# Patient Record
Sex: Female | Born: 1980 | Race: Black or African American | Hispanic: No | Marital: Married | Smoking: Current every day smoker
Health system: Southern US, Community
[De-identification: ages and names within clinical notes are randomized; demographics above are authoritative.]

## PROBLEM LIST (undated history)

## (undated) DIAGNOSIS — N809 Endometriosis, unspecified: Secondary | ICD-10-CM

## (undated) DIAGNOSIS — I10 Essential (primary) hypertension: Secondary | ICD-10-CM

---

## 2014-06-01 ENCOUNTER — Emergency Department
Admit: 2014-06-01 | Disposition: A | Discharge status: Home / Self Care | Payer: Self-pay | Admitting: Emergency Medicine

## 2014-06-01 LAB — COMPREHENSIVE METABOLIC PANEL
Albumin: 3.7 g/dL
Alkaline Phosphatase: 55 U/L
Anion Gap: 7 (ref 7–16)
BUN: 7 mg/dL
Bilirubin,Total: 0.4 mg/dL
Calcium, Total: 8.9 mg/dL
Chloride: 111 mmol/L
Co2: 22 mmol/L
Creatinine: 0.91 mg/dL
EGFR (African American): >60
EGFR (Non-African Amer.): >60
Glucose: 119 mg/dL — ABNORMAL HIGH
Potassium: 3.5 mmol/L
SGOT(AST): 20 U/L
SGPT (ALT): 12 U/L — ABNORMAL LOW
Sodium: 140 mmol/L
Total Protein: 6.7 g/dL

## 2014-06-01 LAB — CBC WITH DIFFERENTIAL/PLATELET
Basophil #: 0.1 10*3/uL (ref 0.0–0.1)
Basophil %: 1.3 %
Eosinophil #: 0.3 10*3/uL (ref 0.0–0.7)
Eosinophil %: 3.6 %
HCT: 35.4 % (ref 35.0–47.0)
HGB: 11.1 g/dL — ABNORMAL LOW (ref 12.0–16.0)
Lymphocyte #: 3.1 10*3/uL (ref 1.0–3.6)
Lymphocyte %: 44.1 %
MCH: 24.5 pg — ABNORMAL LOW (ref 26.0–34.0)
MCHC: 31.2 g/dL — ABNORMAL LOW (ref 32.0–36.0)
MCV: 79 fL — ABNORMAL LOW (ref 80–100)
Monocyte #: 0.8 x10 3/mm (ref 0.2–0.9)
Monocyte %: 10.8 %
Neutrophil #: 2.8 10*3/uL (ref 1.4–6.5)
Neutrophil %: 40.2 %
Platelet: 363 10*3/uL (ref 150–440)
RBC: 4.51 10*6/uL (ref 3.80–5.20)
RDW: 14.7 % — ABNORMAL HIGH (ref 11.5–14.5)
WBC: 7.1 10*3/uL (ref 3.6–11.0)

## 2014-06-01 LAB — URINALYSIS, COMPLETE
Bacteria: NONE SEEN
Bilirubin,UR: NEGATIVE
Glucose,UR: NEGATIVE mg/dL (ref 0–75)
Leukocyte Esterase: NEGATIVE
Nitrite: NEGATIVE
Ph: 6 (ref 4.5–8.0)
Protein: 100
Specific Gravity: 1.027 (ref 1.003–1.030)

## 2014-06-01 LAB — TROPONIN I: Troponin-I: <0.03 ng/mL

## 2014-06-01 LAB — LIPASE, BLOOD: Lipase: 36 U/L

## 2016-02-08 ENCOUNTER — Emergency Department: Payer: Self-pay

## 2016-02-08 ENCOUNTER — Emergency Department
Admission: EM | Admit: 2016-02-08 | Discharge: 2016-02-08 | Disposition: A | Discharge status: Home / Self Care | Payer: Self-pay | Attending: Emergency Medicine | Admitting: Emergency Medicine

## 2016-02-08 ENCOUNTER — Encounter: Payer: Self-pay | Admitting: *Deleted

## 2016-02-08 DIAGNOSIS — N201 Calculus of ureter: Secondary | ICD-10-CM | POA: Insufficient documentation

## 2016-02-08 DIAGNOSIS — I10 Essential (primary) hypertension: Secondary | ICD-10-CM | POA: Insufficient documentation

## 2016-02-08 DIAGNOSIS — Z5181 Encounter for therapeutic drug level monitoring: Secondary | ICD-10-CM | POA: Insufficient documentation

## 2016-02-08 DIAGNOSIS — N200 Calculus of kidney: Secondary | ICD-10-CM

## 2016-02-08 DIAGNOSIS — F17210 Nicotine dependence, cigarettes, uncomplicated: Secondary | ICD-10-CM | POA: Insufficient documentation

## 2016-02-08 DIAGNOSIS — K859 Acute pancreatitis without necrosis or infection, unspecified: Secondary | ICD-10-CM | POA: Insufficient documentation

## 2016-02-08 HISTORY — DX: Essential (primary) hypertension: I10

## 2016-02-08 HISTORY — DX: Endometriosis, unspecified: N80.9

## 2016-02-08 LAB — COMPREHENSIVE METABOLIC PANEL
ALT: 12 U/L — ABNORMAL LOW (ref 14–54)
AST: 27 U/L (ref 15–41)
Albumin: 4.5 g/dL (ref 3.5–5.0)
Alkaline Phosphatase: 54 U/L (ref 38–126)
Anion gap: 10 (ref 5–15)
BUN: 11 mg/dL (ref 6–20)
CO2: 23 mmol/L (ref 22–32)
Calcium: 9.8 mg/dL (ref 8.9–10.3)
Chloride: 106 mmol/L (ref 101–111)
Creatinine, Ser: 1.13 mg/dL — ABNORMAL HIGH (ref 0.44–1.00)
GFR calc Af Amer: >60 mL/min (ref >60)
GFR calc non Af Amer: >60 mL/min (ref >60)
Glucose, Bld: 155 mg/dL — ABNORMAL HIGH (ref 65–99)
Potassium: 3.4 mmol/L — ABNORMAL LOW (ref 3.5–5.1)
Sodium: 139 mmol/L (ref 135–145)
Total Bilirubin: 0.2 mg/dL — ABNORMAL LOW (ref 0.3–1.2)
Total Protein: 8.1 g/dL (ref 6.5–8.1)

## 2016-02-08 LAB — URINALYSIS, COMPLETE (UACMP) WITH MICROSCOPIC
Bacteria, UA: NONE SEEN
Bilirubin Urine: NEGATIVE
Glucose, UA: NEGATIVE mg/dL
Ketones, ur: NEGATIVE mg/dL
Nitrite: NEGATIVE
Protein, ur: 30 mg/dL — AB
Specific Gravity, Urine: >1.046 — ABNORMAL HIGH (ref 1.005–1.030)
pH: 6 (ref 5.0–8.0)

## 2016-02-08 LAB — URINE DRUG SCREEN, QUALITATIVE (ARMC ONLY)
Amphetamines, Ur Screen: NOT DETECTED
Barbiturates, Ur Screen: NOT DETECTED
Benzodiazepine, Ur Scrn: NOT DETECTED
Cannabinoid 50 Ng, Ur ~~LOC~~: NOT DETECTED
Cocaine Metabolite,Ur ~~LOC~~: NOT DETECTED
MDMA (Ecstasy)Ur Screen: NOT DETECTED
Methadone Scn, Ur: NOT DETECTED
Opiate, Ur Screen: POSITIVE — AB
Phencyclidine (PCP) Ur S: NOT DETECTED
Tricyclic, Ur Screen: NOT DETECTED

## 2016-02-08 LAB — CBC WITH DIFFERENTIAL/PLATELET
Basophils Absolute: 0.1 10*3/uL (ref 0–0.1)
Basophils Relative: 1 %
Eosinophils Absolute: 0.2 10*3/uL (ref 0–0.7)
Eosinophils Relative: 2 %
HCT: 32.6 % — ABNORMAL LOW (ref 35.0–47.0)
Hemoglobin: 10.4 g/dL — ABNORMAL LOW (ref 12.0–16.0)
Lymphocytes Relative: 33 %
Lymphs Abs: 3.6 10*3/uL (ref 1.0–3.6)
MCH: 22.5 pg — ABNORMAL LOW (ref 26.0–34.0)
MCHC: 31.9 g/dL — ABNORMAL LOW (ref 32.0–36.0)
MCV: 70.5 fL — ABNORMAL LOW (ref 80.0–100.0)
Monocytes Absolute: 1 10*3/uL — ABNORMAL HIGH (ref 0.2–0.9)
Monocytes Relative: 9 %
Neutro Abs: 6.1 10*3/uL (ref 1.4–6.5)
Neutrophils Relative %: 55 %
Platelets: 468 10*3/uL — ABNORMAL HIGH (ref 150–440)
RBC: 4.62 MIL/uL (ref 3.80–5.20)
RDW: 17.3 % — ABNORMAL HIGH (ref 11.5–14.5)
WBC: 10.9 10*3/uL (ref 3.6–11.0)

## 2016-02-08 LAB — LIPASE, BLOOD
Lipase: 117 U/L — ABNORMAL HIGH (ref 11–51)
Lipase: 88 U/L — ABNORMAL HIGH (ref 11–51)

## 2016-02-08 LAB — ETHANOL: Alcohol, Ethyl (B): <5 mg/dL (ref <5)

## 2016-02-08 MED ORDER — MORPHINE SULFATE (PF) 4 MG/ML IV SOLN
2.0000 mg | Freq: Once | INTRAVENOUS | Status: AC
Start: 2016-02-08 — End: 2016-02-08
  Administered 2016-02-08: 2 mg via INTRAVENOUS

## 2016-02-08 MED ORDER — ONDANSETRON HCL 4 MG/2ML IJ SOLN
INTRAMUSCULAR | Status: AC
  Administered 2016-02-08: 4 mg via INTRAVENOUS
  Filled 2016-02-08: qty 2

## 2016-02-08 MED ORDER — SODIUM CHLORIDE 0.9 % IV BOLUS (SEPSIS)
1000.0000 mL | Freq: Once | INTRAVENOUS | Status: AC
  Administered 2016-02-08: 1000 mL via INTRAVENOUS

## 2016-02-08 MED ORDER — MORPHINE SULFATE (PF) 4 MG/ML IV SOLN
INTRAVENOUS | Status: AC
  Administered 2016-02-08: 2 mg via INTRAVENOUS
  Filled 2016-02-08: qty 1

## 2016-02-08 MED ORDER — ONDANSETRON 4 MG PO TBDP
4.0000 mg | ORAL_TABLET | Freq: Once | ORAL | Status: AC
  Administered 2016-02-08: 4 mg via ORAL
  Filled 2016-02-08: qty 1

## 2016-02-08 MED ORDER — IOPAMIDOL (ISOVUE-300) INJECTION 61%
100.0000 mL | Freq: Once | INTRAVENOUS | Status: AC | PRN
  Administered 2016-02-08: 100 mL via INTRAVENOUS

## 2016-02-08 MED ORDER — HYDROMORPHONE HCL 1 MG/ML IJ SOLN
INTRAMUSCULAR | Status: AC
  Administered 2016-02-08: 1 mg via INTRAVENOUS
  Filled 2016-02-08: qty 1

## 2016-02-08 MED ORDER — ONDANSETRON HCL 4 MG/2ML IJ SOLN
4.0000 mg | Freq: Once | INTRAMUSCULAR | Status: AC
  Administered 2016-02-08: 4 mg via INTRAVENOUS

## 2016-02-08 MED ORDER — HYDROMORPHONE HCL 1 MG/ML IJ SOLN
1.0000 mg | INTRAMUSCULAR | Status: AC
  Administered 2016-02-08: 1 mg via INTRAVENOUS

## 2016-02-08 MED ORDER — OXYCODONE-ACETAMINOPHEN 5-325 MG PO TABS
1.0000 | ORAL_TABLET | Freq: Once | ORAL | Status: AC
  Administered 2016-02-08: 1 via ORAL
  Filled 2016-02-08: qty 1

## 2016-02-08 MED ORDER — IOPAMIDOL (ISOVUE-300) INJECTION 61%: 30.0000 mL | Freq: Once | INTRAVENOUS | Status: DC | PRN

## 2016-02-08 MED ORDER — OXYCODONE-ACETAMINOPHEN 5-325 MG PO TABS: 1.0000 | ORAL_TABLET | ORAL | 0 refills | Status: DC | PRN

## 2016-02-08 MED ORDER — MORPHINE SULFATE (PF) 4 MG/ML IV SOLN
4.0000 mg | INTRAVENOUS | Status: AC
  Administered 2016-02-08: 4 mg via INTRAVENOUS

## 2016-02-08 MED ORDER — MORPHINE SULFATE (PF) 4 MG/ML IV SOLN
INTRAVENOUS | Status: AC
  Filled 2016-02-08: qty 4

## 2016-02-08 MED ORDER — ONDANSETRON HCL 4 MG PO TABS: 4.0000 mg | ORAL_TABLET | Freq: Every day | ORAL | 1 refills | Status: AC | PRN

## 2016-02-08 NOTE — ED Notes (Addendum)
RN's ased pt to provide urine sample. PT stated she could not urinate at this time. Will attempt after more saline has been administered to pt.

## 2016-02-08 NOTE — ED Triage Notes (Signed)
PT arrived to ED reporting abd pain that began sudden;y 30 minutes before calling EMS. Pt has unsteady gate and presents with a decreased level of consciousness. Pt is drooling and spiting upon arrival. Pt is alert and oriented to time situation and self but disoriented to place upon arrival. Pt unable to answer questions due to reported pain.

## 2016-02-08 NOTE — ED Notes (Signed)
ED Provider at bedside. 

## 2016-02-08 NOTE — ED Provider Notes (Signed)
[facility] Emergency Department Provider Note   First MD Initiated Contact with Patient 02/08/16 0007     (approximate)  I have reviewed the triage vital signs and the nursing notes.   HISTORY  Chief Complaint Abdominal Pain    HPI Elizabeth Gould is a 35 y.o. female with bullous chronic medical conditions presents to the emergency department with acute onset of generalized abdominal pain associated with vomiting 30 minutes before presentation. Patient states her current pain score is 10 out of 10. Patient denies any fever afebrile on presentation with temperature 98.7.   Past Medical History:  Diagnosis Date  . Endometriosis   . Hypertension     There are no active problems to display for this patient.   History reviewed. No pertinent surgical history.  Prior to Admission medications   Not on File    Allergies Patient has no allergy information on record.  History reviewed. No pertinent family history.  Social History Social History  Substance Use Topics  . Smoking status: Current Every Day Smoker    Packs/day: 0.50    Types: Cigarettes  . Smokeless tobacco: Never Used  . Alcohol use No    Review of Systems Constitutional: No fever/chills Eyes: No visual changes. ENT: No sore throat. Cardiovascular: Denies chest pain. Respiratory: Denies shortness of breath. Gastrointestinal: Positive for abdominal pain and vomiting.  No diarrhea.  No constipation. Genitourinary: Negative for dysuria. Musculoskeletal: Negative for back pain. Skin: Negative for rash. Neurological: Negative for headaches, focal weakness or numbness.  10-point ROS otherwise negative.  ____________________________________________   PHYSICAL EXAM:  VITAL SIGNS: ED Triage Vitals  Enc Vitals Group     BP 02/08/16 0000 (!) 86/64     Pulse Rate 02/08/16 0000 80     Resp 02/08/16 0000 (!) 22     Temp 02/08/16 0000 98.7 F (37.1 C)     Temp Source 02/08/16  0000 Oral     SpO2 02/08/16 0000 99 %     Weight 02/08/16 0005 230 lb (104.3 kg)     Height 02/08/16 0005 5\' 7"  (1.702 m)     Head Circumference --      Peak Flow --      Pain Score 02/08/16 0006 10     Pain Loc --      Pain Edu? --      Excl. in GC? --     Constitutional: Alert and oriented. Well appearing and in no acute distress. Eyes: Conjunctivae are normal. PERRL. EOMI. Head: Atraumatic. Ears:  Healthy appearing ear canals and TMs bilaterally Nose: No congestion/rhinnorhea. Mouth/Throat: Mucous membranes are moist.  Oropharynx non-erythematous. Neck: No stridor.   Cardiovascular: Normal rate, regular rhythm. Good peripheral circulation. Grossly normal heart sounds. Respiratory: Normal respiratory effort.  No retractions. Lungs CTAB. Gastrointestinal: Left lower quadrant tenderness to palpation.. No distention.  Musculoskeletal: No lower extremity tenderness nor edema. No gross deformities of extremities. Neurologic:  Normal speech and language. No gross focal neurologic deficits are appreciated.  Skin:  Skin is warm, dry and intact. No rash noted. Psychiatric: Mood and affect are normal. Speech and behavior are normal.  ____________________________________________   LABS (all labs ordered are listed, but only abnormal results are displayed)  Labs Reviewed  CBC WITH DIFFERENTIAL/PLATELET - Abnormal; Notable for the following:       Result Value   Hemoglobin 10.4 (*)    HCT 32.6 (*)    MCV 70.5 (*)    MCH 22.5 (*)  MCHC 31.9 (*)    RDW 17.3 (*)    Platelets 468 (*)    Monocytes Absolute 1.0 (*)    All other components within normal limits  COMPREHENSIVE METABOLIC PANEL - Abnormal; Notable for the following:    Potassium 3.4 (*)    Glucose, Bld 155 (*)    Creatinine, Ser 1.13 (*)    ALT 12 (*)    Total Bilirubin 0.2 (*)    All other components within normal limits  LIPASE, BLOOD - Abnormal; Notable for the following:    Lipase 117 (*)    All other components  within normal limits  URINE DRUG SCREEN, QUALITATIVE ([facility] ONLY) - Abnormal; Notable for the following:    Opiate, Ur Screen POSITIVE (*)    All other components within normal limits  URINALYSIS, COMPLETE (UACMP) WITH MICROSCOPIC - Abnormal; Notable for the following:    Color, Urine YELLOW (*)    APPearance CLEAR (*)    Specific Gravity, Urine >1.046 (*)    Hgb urine dipstick SMALL (*)    Protein, ur 30 (*)    Leukocytes, UA TRACE (*)    Squamous Epithelial / LPF 6-30 (*)    All other components within normal limits  LIPASE, BLOOD - Abnormal; Notable for the following:    Lipase 88 (*)    All other components within normal limits  ETHANOL    RADIOLOGY I, [place] N BROWN, personally viewed and evaluated these images (plain radiographs) as part of my medical decision making, as well as reviewing the written report by the radiologist.  Ct Abdomen Pelvis W Contrast  Result Date: 02/08/2016 CLINICAL DATA:  Sudden onset abdominal pain 30 minutes prior to arrival. EXAM: CT ABDOMEN AND PELVIS WITH CONTRAST TECHNIQUE: Multidetector CT imaging of the abdomen and pelvis was performed using the standard protocol following bolus administration of intravenous contrast. CONTRAST:  100mL ISOVUE-300 IOPAMIDOL (ISOVUE-300) INJECTION 61% COMPARISON:  None. FINDINGS: Lower chest: Large hiatal hernia. Lung bases are clear. No effusions. Hepatobiliary: No focal liver abnormality is seen. No gallstones, gallbladder wall thickening, or biliary dilatation. Pancreas: Unremarkable. No pancreatic ductal dilatation or surrounding inflammatory changes. Spleen: Normal in size without focal abnormality. Adrenals/Urinary Tract: Both adrenals are normal. Right kidney and ureter are normal. Left hydronephrosis and hydroureter. Perinephric and periureteral stranding. Punctate calcification may represent a 2 mm distal left ureteral calculus, within 1 cm of the ureterovesical junction. Image 42 series 7. Urinary bladder is  nearly completely empty, grossly unremarkable. Stomach/Bowel: Stomach is within normal limits. Appendix is normal. No evidence of bowel wall thickening, distention, or inflammatory changes. Vascular/Lymphatic: No significant vascular findings are present. No enlarged abdominal or pelvic lymph nodes. Reproductive: Uterus and bilateral adnexa are unremarkable. Other: No ascites.  Small fat containing umbilical hernia. Musculoskeletal: No significant skeletal lesion. IMPRESSION: Left hydronephrosis and hydroureter with perinephric and periureteral stranding consistent with acute obstructive uropathy. Probable 2 mm distal left ureteral calculus, 1 cm from the UVJ. No other acute findings. Hiatal hernia.  Small fat containing umbilical hernia. Electronically Signed   By: Ellery Plunkaniel R Mitchell M.D.   On: 02/08/2016 02:04      Procedures     INITIAL IMPRESSION / ASSESSMENT AND PLAN / ED COURSE  Pertinent labs & imaging results that were available during my care of the patient were reviewed by me and considered in my medical decision making (see chart for details).  Patient was given IV morphine multiple doses without any improvement of pain as such IV Dilaudid was  given with complete resolution of pain.   Clinical Course     ____________________________________________  FINAL CLINICAL IMPRESSION(S) / ED DIAGNOSES  Final diagnoses:  Kidney stone  Acute pancreatitis, unspecified complication status, unspecified pancreatitis type     MEDICATIONS GIVEN DURING THIS VISIT:  Medications  iopamidol (ISOVUE-300) 61 % injection 30 mL (not administered)  morphine 4 MG/ML injection 2 mg (2 mg Intravenous Given 02/08/16 0035)  ondansetron (ZOFRAN) injection 4 mg (4 mg Intravenous Given 02/08/16 0034)  sodium chloride 0.9 % bolus 1,000 mL (0 mLs Intravenous Stopped 02/08/16 0348)  iopamidol (ISOVUE-300) 61 % injection 100 mL (100 mLs Intravenous Contrast Given 02/08/16 0137)  morphine 4 MG/ML injection  4 mg (4 mg Intravenous Given 02/08/16 0130)  HYDROmorphone (DILAUDID) injection 1 mg (1 mg Intravenous Given 02/08/16 0246)     NEW OUTPATIENT MEDICATIONS STARTED DURING THIS VISIT:  New Prescriptions   No medications on file    Modified Medications   No medications on file    Discontinued Medications   No medications on file     Note:  This document was prepared using Dragon voice recognition software and may include unintentional dictation errors.    Darci Currentandolph N Brown, MD 02/08/16 [phone]

## 2016-02-08 NOTE — ED Notes (Signed)
Patient transported to CT 

## 2016-02-08 NOTE — ED Notes (Signed)
PT drinking contrast at this time

## 2016-02-08 NOTE — ED Notes (Signed)
Patient given  Sprite to drink.

## 2016-02-08 NOTE — ED Notes (Signed)
Urine pregnancy NEGATIVE.  

## 2016-04-10 ENCOUNTER — Emergency Department
Admission: EM | Admit: 2016-04-10 | Discharge: 2016-04-10 | Disposition: A | Discharge status: Home / Self Care | Payer: Medicaid Other | Attending: Student in an Organized Health Care Education/Training Program | Admitting: Student in an Organized Health Care Education/Training Program

## 2016-04-10 ENCOUNTER — Encounter: Payer: Self-pay | Admitting: *Deleted

## 2016-04-10 DIAGNOSIS — N809 Endometriosis, unspecified: Secondary | ICD-10-CM | POA: Insufficient documentation

## 2016-04-10 DIAGNOSIS — I10 Essential (primary) hypertension: Secondary | ICD-10-CM | POA: Insufficient documentation

## 2016-04-10 DIAGNOSIS — R1030 Lower abdominal pain, unspecified: Secondary | ICD-10-CM

## 2016-04-10 DIAGNOSIS — F17210 Nicotine dependence, cigarettes, uncomplicated: Secondary | ICD-10-CM | POA: Insufficient documentation

## 2016-04-10 LAB — URINALYSIS, COMPLETE (UACMP) WITH MICROSCOPIC
Glucose, UA: NEGATIVE mg/dL
Ketones, ur: 5 mg/dL — AB
Nitrite: NEGATIVE
Protein, ur: 30 mg/dL — AB
Specific Gravity, Urine: 1.035 — ABNORMAL HIGH (ref 1.005–1.030)
pH: 5 (ref 5.0–8.0)

## 2016-04-10 LAB — CBC
HCT: 29.1 % — ABNORMAL LOW (ref 35.0–47.0)
Hemoglobin: 9.3 g/dL — ABNORMAL LOW (ref 12.0–16.0)
MCH: 21.6 pg — ABNORMAL LOW (ref 26.0–34.0)
MCHC: 31.9 g/dL — ABNORMAL LOW (ref 32.0–36.0)
MCV: 67.8 fL — ABNORMAL LOW (ref 80.0–100.0)
Platelets: 414 10*3/uL (ref 150–440)
RBC: 4.3 MIL/uL (ref 3.80–5.20)
RDW: 17.3 % — ABNORMAL HIGH (ref 11.5–14.5)
WBC: 6.8 10*3/uL (ref 3.6–11.0)

## 2016-04-10 LAB — COMPREHENSIVE METABOLIC PANEL
ALT: 12 U/L — ABNORMAL LOW (ref 14–54)
AST: 19 U/L (ref 15–41)
Albumin: 3.9 g/dL (ref 3.5–5.0)
Alkaline Phosphatase: 57 U/L (ref 38–126)
Anion gap: 6 (ref 5–15)
BUN: 9 mg/dL (ref 6–20)
CO2: 26 mmol/L (ref 22–32)
Calcium: 9.1 mg/dL (ref 8.9–10.3)
Chloride: 107 mmol/L (ref 101–111)
Creatinine, Ser: 0.78 mg/dL (ref 0.44–1.00)
GFR calc Af Amer: >60 mL/min (ref >60)
GFR calc non Af Amer: >60 mL/min (ref >60)
Glucose, Bld: 93 mg/dL (ref 65–99)
Potassium: 3.4 mmol/L — ABNORMAL LOW (ref 3.5–5.1)
Sodium: 139 mmol/L (ref 135–145)
Total Bilirubin: 0.4 mg/dL (ref 0.3–1.2)
Total Protein: 6.7 g/dL (ref 6.5–8.1)

## 2016-04-10 LAB — POCT PREGNANCY, URINE: Preg Test, Ur: NEGATIVE

## 2016-04-10 MED ORDER — NAPROXEN 375 MG PO TABS: 375.0000 mg | ORAL_TABLET | Freq: Two times a day (BID) | ORAL | 0 refills | Status: AC

## 2016-04-10 MED ORDER — HYDROCODONE-ACETAMINOPHEN 5-325 MG PO TABS: 1.0000 | ORAL_TABLET | ORAL | 0 refills | Status: DC | PRN

## 2016-04-10 MED ORDER — CYCLOBENZAPRINE HCL 10 MG PO TABS: 10.0000 mg | ORAL_TABLET | Freq: Three times a day (TID) | ORAL | 0 refills | Status: DC | PRN

## 2016-04-10 MED ORDER — OXYCODONE-ACETAMINOPHEN 5-325 MG PO TABS
2.0000 | ORAL_TABLET | Freq: Once | ORAL | Status: AC
  Administered 2016-04-10: 2 via ORAL
  Filled 2016-04-10: qty 2

## 2016-04-10 NOTE — ED Triage Notes (Signed)
States lower abd pain for several weeks, states hx of endometriosis, states some vaginal spotting, awake and alert in no acute distress

## 2016-04-10 NOTE — ED Notes (Signed)
Pt states percocet "does the trick" for pain, is being driven home to rest.

## 2016-04-10 NOTE — ED Notes (Signed)
Pt states percocet helps her pain, perc ordered and given. Pt has ride in lobby. Appears in no distress at this time.

## 2016-04-10 NOTE — Discharge Instructions (Signed)

## 2016-04-10 NOTE — ED Provider Notes (Signed)
[facility] Emergency Department Provider Note    First MD Initiated Contact with Patient 04/10/16 1003     (approximate)  I have reviewed the triage vital signs and the nursing notes.   HISTORY  Chief Complaint Abdominal Pain    HPI Elizabeth Gould is a 36 y.o. female with history of endometriosis presents with several weeks of constant recurrent lower abdominal pain consistent with her endometriosis. States she's also having some vaginal spotting. The ER today due to concern for persistent pain and 1 taking a second opinion. Patient states that she's been seen at [facility] but does not feel that the they're interested in her care. She's been taking over-the-counter medications including Aleve, Motrin as well as Tylenol without any significant relief. She's discussed the option of surgery with the GYN physician at [facility] but have not moved forward thus far. She denies any fevers.   Past Medical History:  Diagnosis Date  . Endometriosis   . Hypertension    History reviewed. No pertinent family history. History reviewed. No pertinent surgical history. There are no active problems to display for this patient.     Prior to Admission medications   Medication Sig Start Date End Date Taking? Authorizing Provider  cyclobenzaprine (FLEXERIL) 10 MG tablet Take 1 tablet (10 mg total) by mouth 3 (three) times daily as needed for muscle spasms. 04/10/16   Willy Eddy, MD  HYDROcodone-acetaminophen (NORCO) 5-325 MG tablet Take 1 tablet by mouth every 4 (four) hours as needed for moderate pain. 04/10/16   Willy Eddy, MD  naproxen (NAPROSYN) 375 MG tablet Take 1 tablet (375 mg total) by mouth 2 (two) times daily with a meal. 04/10/16 04/20/16  Willy Eddy, MD  ondansetron [facility]) 4 MG tablet Take 1 tablet (4 mg total) by mouth daily as needed for nausea or vomiting. 02/08/16 02/07/17  Darci Current, MD  oxyCODONE-acetaminophen (ROXICET) 5-325 MG tablet Take 1  tablet by mouth every 4 (four) hours as needed for severe pain. 02/08/16   Darci Current, MD    Allergies Patient has no known allergies.    Social History Social History  Substance Use Topics  . Smoking status: Current Every Day Smoker    Packs/day: 0.50    Types: Cigarettes  . Smokeless tobacco: Never Used  . Alcohol use No    Review of Systems Patient denies headaches, rhinorrhea, blurry vision, numbness, shortness of breath, chest pain, edema, cough, abdominal pain, nausea, vomiting, diarrhea, dysuria, fevers, rashes or hallucinations unless otherwise stated above in HPI. ____________________________________________   PHYSICAL EXAM:  VITAL SIGNS: Vitals:   04/10/16 0900  BP: 127/65  Pulse: 85  Resp: 18  Temp: 98.2 F (36.8 C)    Constitutional: Alert and oriented. Well appearing and in no acute distress. Eyes: Conjunctivae are normal. PERRL. EOMI. Head: Atraumatic. Nose: No congestion/rhinnorhea. Mouth/Throat: Mucous membranes are moist.  Oropharynx non-erythematous. Neck: No stridor. Painless ROM. No cervical spine tenderness to palpation Hematological/Lymphatic/Immunilogical: No cervical lymphadenopathy. Cardiovascular: Normal rate, regular rhythm. Grossly normal heart sounds.  Good peripheral circulation. Respiratory: Normal respiratory effort.  No retractions. Lungs CTAB. Gastrointestinal: Soft and nontender. No distention. No abdominal bruits. No CVA tenderness. Genitourinary:  Musculoskeletal: No lower extremity tenderness nor edema.  No joint effusions. Neurologic:  Normal speech and language. No gross focal neurologic deficits are appreciated. No gait instability. Skin:  Skin is warm, dry and intact. No rash noted. Psychiatric: Mood and affect are normal. Speech and behavior are normal.  ____________________________________________  LABS (all labs ordered are listed, but only abnormal results are displayed)  Results for orders placed or  performed during the hospital encounter of 04/10/16 (from the past 24 hour(s))  Comprehensive metabolic panel     Status: Abnormal   Collection Time: 04/10/16  8:59 AM  Result Value Ref Range   Sodium 139 135 - 145 mmol/L   Potassium 3.4 (L) 3.5 - 5.1 mmol/L   Chloride 107 101 - 111 mmol/L   CO2 26 22 - 32 mmol/L   Glucose, Bld 93 65 - 99 mg/dL   BUN 9 6 - 20 mg/dL   Creatinine, Ser 8.110.78 0.44 - 1.00 mg/dL   Calcium 9.1 8.9 - 91.410.3 mg/dL   Total Protein 6.7 6.5 - 8.1 g/dL   Albumin 3.9 3.5 - 5.0 g/dL   AST 19 15 - 41 U/L   ALT 12 (L) 14 - 54 U/L   Alkaline Phosphatase 57 38 - 126 U/L   Total Bilirubin 0.4 0.3 - 1.2 mg/dL   GFR calc non Af Amer >60 >60 mL/min   GFR calc Af Amer >60 >60 mL/min   Anion gap 6 5 - 15  CBC     Status: Abnormal   Collection Time: 04/10/16  8:59 AM  Result Value Ref Range   WBC 6.8 3.6 - 11.0 K/uL   RBC 4.30 3.80 - 5.20 MIL/uL   Hemoglobin 9.3 (L) 12.0 - 16.0 g/dL   HCT 78.229.1 (L) 95.635.0 - 21.347.0 %   MCV 67.8 (L) 80.0 - 100.0 fL   MCH 21.6 (L) 26.0 - 34.0 pg   MCHC 31.9 (L) 32.0 - 36.0 g/dL   RDW 08.617.3 (H) 57.811.5 - 46.914.5 %   Platelets 414 150 - 440 K/uL  Urinalysis, Complete w Microscopic     Status: Abnormal   Collection Time: 04/10/16  9:02 AM  Result Value Ref Range   Color, Urine AMBER (A) YELLOW   APPearance HAZY (A) CLEAR   Specific Gravity, Urine 1.035 (H) 1.005 - 1.030   pH 5.0 5.0 - 8.0   Glucose, UA NEGATIVE NEGATIVE mg/dL   Hgb urine dipstick SMALL (A) NEGATIVE   Bilirubin Urine SMALL (A) NEGATIVE   Ketones, ur 5 (A) NEGATIVE mg/dL   Protein, ur 30 (A) NEGATIVE mg/dL   Nitrite NEGATIVE NEGATIVE   Leukocytes, UA TRACE (A) NEGATIVE   RBC / HPF 0-5 0 - 5 RBC/hpf   WBC, UA 0-5 0 - 5 WBC/hpf   Bacteria, UA RARE (A) NONE SEEN   Squamous Epithelial / LPF 6-30 (A) NONE SEEN   Mucous PRESENT    Ca Oxalate Crys, UA PRESENT   Pregnancy, urine POC     Status: None   Collection Time: 04/10/16  9:50 AM  Result Value Ref Range   Preg Test, Ur NEGATIVE  NEGATIVE   ____________________________________________  EKG ____________________________________________  RADIOLOGY   ____________________________________________   PROCEDURES  Procedure(s) performed:  Procedures    Critical Care performed: no ____________________________________________   INITIAL IMPRESSION / ASSESSMENT AND PLAN / ED COURSE  Pertinent labs & imaging results that were available during my care of the patient were reviewed by me and considered in my medical decision making (see chart for details).  DDX: endometriosis, ectopic, torsion, toa  Wynelle LinkLatoya Brian is a 36 y.o. who presents to the ED with chronic abdominal pain. Patient afebrile hemodynamic stable. There's been no acute change in character of her chronic lower abdominal pain. 2 Phyllis consistent with her chronic endometriosis. We'll provide  pain medication I discussed signs and symptoms for which she should return to the ER. Her blood work is otherwise reassuring. We'll vie referral to [facility] for second opinion.  Have discussed with the patient and available family all diagnostics and treatments performed thus far and all questions were answered to the best of my ability. The patient demonstrates understanding and agreement with plan.      ____________________________________________   FINAL CLINICAL IMPRESSION(S) / ED DIAGNOSES  Final diagnoses:  Lower abdominal pain  Endometriosis      NEW MEDICATIONS STARTED DURING THIS VISIT:  New Prescriptions   CYCLOBENZAPRINE (FLEXERIL) 10 MG TABLET    Take 1 tablet (10 mg total) by mouth 3 (three) times daily as needed for muscle spasms.   HYDROCODONE-ACETAMINOPHEN (NORCO) 5-325 MG TABLET    Take 1 tablet by mouth every 4 (four) hours as needed for moderate pain.   NAPROXEN (NAPROSYN) 375 MG TABLET    Take 1 tablet (375 mg total) by mouth 2 (two) times daily with a meal.     Note:  This document was prepared using Dragon voice recognition  software and may include unintentional dictation errors.    Willy Eddy, MD 04/10/16 1024

## 2016-09-10 ENCOUNTER — Emergency Department
Admission: EM | Admit: 2016-09-10 | Discharge: 2016-09-10 | Disposition: A | Discharge status: Home / Self Care | Payer: Medicaid Other | Attending: Emergency Medicine | Admitting: Emergency Medicine

## 2016-09-10 DIAGNOSIS — F17210 Nicotine dependence, cigarettes, uncomplicated: Secondary | ICD-10-CM | POA: Insufficient documentation

## 2016-09-10 DIAGNOSIS — Z8742 Personal history of other diseases of the female genital tract: Secondary | ICD-10-CM | POA: Insufficient documentation

## 2016-09-10 DIAGNOSIS — N809 Endometriosis, unspecified: Secondary | ICD-10-CM | POA: Insufficient documentation

## 2016-09-10 DIAGNOSIS — R1030 Lower abdominal pain, unspecified: Secondary | ICD-10-CM | POA: Insufficient documentation

## 2016-09-10 LAB — URINALYSIS, COMPLETE (UACMP) WITH MICROSCOPIC
Bilirubin Urine: NEGATIVE
Glucose, UA: NEGATIVE mg/dL
Ketones, ur: 5 mg/dL — AB
Leukocytes, UA: NEGATIVE
Nitrite: NEGATIVE
Protein, ur: 100 mg/dL — AB
Specific Gravity, Urine: 1.029 (ref 1.005–1.030)
pH: 6 (ref 5.0–8.0)

## 2016-09-10 LAB — POCT PREGNANCY, URINE: Preg Test, Ur: NEGATIVE

## 2016-09-10 MED ORDER — KETOROLAC TROMETHAMINE 60 MG/2ML IM SOLN
30.0000 mg | Freq: Once | INTRAMUSCULAR | Status: AC
  Administered 2016-09-10: 30 mg via INTRAMUSCULAR
  Filled 2016-09-10: qty 2

## 2016-09-10 MED ORDER — MEDROXYPROGESTERONE ACETATE 150 MG/ML IM SUSP
150.0000 mg | Freq: Once | INTRAMUSCULAR | Status: AC
  Administered 2016-09-10: 150 mg via INTRAMUSCULAR
  Filled 2016-09-10: qty 1

## 2016-09-10 MED ORDER — CARISOPRODOL 350 MG PO TABS: 350.0000 mg | ORAL_TABLET | Freq: Three times a day (TID) | ORAL | 0 refills | Status: DC | PRN

## 2016-09-10 MED ORDER — CYCLOBENZAPRINE HCL 10 MG PO TABS
10.0000 mg | ORAL_TABLET | Freq: Once | ORAL | Status: AC
  Administered 2016-09-10: 10 mg via ORAL
  Filled 2016-09-10: qty 1

## 2016-09-10 NOTE — ED Triage Notes (Signed)
Pt states that she has endometriosis and has been having pelvic pain for over a month.  Pt states that she last saw her OB-GYN 6 months ago.  Pt states she was receiving depo shot for endometriosis, but insurance no longer pays for it.  Pt states she has not slept in 3 days because of pain in pelvic area.  Pt states she is having some bleeding when she wipes.  Pt is ambulatory to triage and A&Ox4.

## 2016-09-10 NOTE — ED Provider Notes (Signed)
[facility] Emergency Department Provider Note  ____________________________________________   First MD Initiated Contact with Patient 09/10/16 1722     (approximate)  I have reviewed the triage vital signs and the nursing notes.   HISTORY  Chief Complaint Endometriosis and Pelvic Pain   HPI Elizabeth Gould is a 36 y.o. female with a history of endometriosis as well as hypertension who is presenting to the emergency department today complaining of lower abdominal pain over the past month. She says that this feels like her chronic endometriosis. Says that she last had her diaper shot this past December but has had financial difficulties and is therefore not been able to get back in to see her OB/GYN at [facility]. She does not report any nausea or vomiting. Says the pain is a 10 out of 10 right now and says that it comes in waves. Does not note any radiation. Says that she has had spotting vaginally over the past couple days and then today had several fingertip sized clots. Denies any vaginal discharge. Says that she had taken a muscle relaxer several days ago which had relieved the pain.   Past Medical History:  Diagnosis Date  . Endometriosis   . Hypertension     There are no active problems to display for this patient.   History reviewed. No pertinent surgical history.  Prior to Admission medications   Medication Sig Start Date End Date Taking? Authorizing Provider  cyclobenzaprine (FLEXERIL) 10 MG tablet Take 1 tablet (10 mg total) by mouth 3 (three) times daily as needed for muscle spasms. Patient not taking: Reported on 09/10/2016 04/10/16   Willy Eddy, MD  HYDROcodone-acetaminophen [facility]) 5-325 MG tablet Take 1 tablet by mouth every 4 (four) hours as needed for moderate pain. Patient not taking: Reported on 09/10/2016 04/10/16   Willy Eddy, MD  ondansetron (ZOFRAN) 4 MG tablet Take 1 tablet (4 mg total) by mouth daily as needed for nausea or  vomiting. Patient not taking: Reported on 09/10/2016 02/08/16 02/07/17  Darci Current, MD  oxyCODONE-acetaminophen (ROXICET) 5-325 MG tablet Take 1 tablet by mouth every 4 (four) hours as needed for severe pain. Patient not taking: Reported on 09/10/2016 02/08/16   Darci Current, MD    Allergies Patient has no known allergies.  No family history on file.  Social History Social History  Substance Use Topics  . Smoking status: Current Every Day Smoker    Packs/day: 0.50    Types: Cigarettes  . Smokeless tobacco: Never Used  . Alcohol use No    Review of Systems  Constitutional: No fever/chills Eyes: No visual changes. ENT: No sore throat. Cardiovascular: Denies chest pain. Respiratory: Denies shortness of breath. Gastrointestinal:  No nausea, no vomiting.  No diarrhea.  No constipation. Genitourinary: Negative for dysuria. Musculoskeletal: Negative for back pain. Skin: Negative for rash. Neurological: Negative for headaches, focal weakness or numbness.   ____________________________________________   PHYSICAL EXAM:  VITAL SIGNS: ED Triage Vitals [09/10/16 1631]  Enc Vitals Group     BP (!) 145/70     Pulse Rate 92     Resp (!) 96     Temp 98.9 F (37.2 C)     Temp Source Oral     SpO2 99 %     Weight 210 lb (95.3 kg)     Height 5\' 7"  (1.702 m)     Head Circumference      Peak Flow      Pain Score 10  Pain Loc      Pain Edu?      Excl. in GC?     Constitutional: Alert and oriented. Well appearing and in no acute distress. Eyes: Conjunctivae are normal.  Head: Atraumatic. Nose: No congestion/rhinnorhea. Mouth/Throat: Mucous membranes are moist.  Neck: No stridor.   Cardiovascular: Normal rate, regular rhythm. Grossly normal heart sounds.   Respiratory: Normal respiratory effort.  No retractions. Lungs CTAB. Gastrointestinal: Soft and nontender. No distention.  Musculoskeletal: No lower extremity tenderness nor edema.  No joint  effusions. Neurologic:  Normal speech and language. No gross focal neurologic deficits are appreciated. Skin:  Skin is warm, dry and intact. No rash noted. Psychiatric: Mood and affect are normal. Speech and behavior are normal.  ____________________________________________   LABS (all labs ordered are listed, but only abnormal results are displayed)  Labs Reviewed  URINALYSIS, COMPLETE (UACMP) WITH MICROSCOPIC - Abnormal; Notable for the following:       Result Value   Color, Urine AMBER (*)    APPearance HAZY (*)    Hgb urine dipstick MODERATE (*)    Ketones, ur 5 (*)    Protein, ur 100 (*)    Bacteria, UA RARE (*)    Squamous Epithelial / LPF 6-30 (*)    All other components within normal limits  POC URINE PREG, ED  POCT PREGNANCY, URINE   ____________________________________________  EKG   ____________________________________________  RADIOLOGY   ____________________________________________   PROCEDURES  Procedure(s) performed:   Procedures  Critical Care performed:   ____________________________________________   INITIAL IMPRESSION / ASSESSMENT AND PLAN / ED COURSE  Pertinent labs & imaging results that were available during my care of the patient were reviewed by me and considered in my medical decision making (see chart for details).  ----------------------------------------- 6:58 PM on 09/10/2016 -----------------------------------------  Patient with exacerbation of chronic symptoms and off of her Low. I discussed the case with Dr. Tiburcio PeaHarris of OB/GYN who agrees with administering a type of shot in the emergency department. The patient will be following up with Dr. Tiburcio PeaHarris in the clinic. She is requesting a muscle relaxer for home. She'll be discharged with [facility]. She is understanding the plan for follow-up in one to comply.      ____________________________________________   FINAL CLINICAL IMPRESSION(S) / ED DIAGNOSES  Acute on chronic pain from  endometriosis.    NEW MEDICATIONS STARTED DURING THIS VISIT:  New Prescriptions   No medications on file     Note:  This document was prepared using Dragon voice recognition software and may include unintentional dictation errors.     Myrna BlazerSchaevitz, Sheray Grist Matthew, MD 09/10/16 [phone]

## 2016-09-19 ENCOUNTER — Encounter: Payer: Self-pay | Admitting: Obstetrics and Gynecology

## 2016-10-09 ENCOUNTER — Encounter: Payer: Self-pay | Admitting: Obstetrics & Gynecology

## 2016-10-10 ENCOUNTER — Encounter: Payer: Self-pay | Admitting: Obstetrics and Gynecology

## 2016-10-11 ENCOUNTER — Ambulatory Visit (INDEPENDENT_AMBULATORY_CARE_PROVIDER_SITE_OTHER): Payer: Self-pay | Admitting: Obstetrics & Gynecology

## 2016-10-11 ENCOUNTER — Encounter: Payer: Self-pay | Admitting: Obstetrics & Gynecology

## 2016-10-11 VITALS — BP 140/80 | HR 88 | Ht 67.0 in | Wt 243.0 lb

## 2016-10-11 DIAGNOSIS — N809 Endometriosis, unspecified: Secondary | ICD-10-CM | POA: Insufficient documentation

## 2016-10-11 DIAGNOSIS — G8929 Other chronic pain: Secondary | ICD-10-CM | POA: Insufficient documentation

## 2016-10-11 DIAGNOSIS — R102 Pelvic and perineal pain: Secondary | ICD-10-CM

## 2016-10-11 MED ORDER — OXYCODONE-ACETAMINOPHEN 5-325 MG PO TABS: 1.0000 | ORAL_TABLET | ORAL | 0 refills | Status: DC | PRN

## 2016-10-11 NOTE — Progress Notes (Signed)
   Gynecology Pelvic Pain Evaluation   Chief Complaint:  Chief Complaint  Patient presents with  . Endometriosis    History of Present Illness:   Patient is a 36 y.o. [id number] who LMP was No LMP recorded. Patient is not currently having periods (Reason: Irregular Periods).(on Depo) presents today for a problem visit.  She complains of pain.   Her pain is localized to the deep pelvis area, described as constant and stabbing, began several months ago and its severity is described as severe. The pain radiates to the  Non-radiating. She has these associated symptoms which include abdominal pain. Patient has these modifiers which include pain medication that make it better and unable to associate with any factor that make it worse.  Context includes: spontaneous.  Prior h/o endometriosis dx by excision of CS scar tissue (never had dx lap).  Has been on Depo in past which stopped periods and helped for a while but not for the past year.  Has financial concerns and not been consistantly on Depo and has not been able to follow up w [facility] she had been seeing for this.  Recent ER visit at [facility] as felt pain was escalating too much.  Previous evaluation: [facility], ER's. Prior Diagnosis: Endometriosis. Previous Treatment: NSAID (OTC) with no improvement, Narcotics with some improvement, Injection Depo with no recent improvement (last shot one week ago).  PMHx: She  has a past medical history of Endometriosis and Hypertension. Also,  has no past surgical history on file., family history includes Diabetes in her paternal grandfather and paternal grandmother; Hypertension in her mother.,  reports that she has been smoking Cigarettes.  She has been smoking about 0.50 packs per day. She has never used smokeless tobacco. She reports that she does not drink alcohol or use drugs.  She has a current medication list which includes the following prescription(s): carisoprodol, cyclobenzaprine, hydrocodone-acetaminophen,  ondansetron, and oxycodone-acetaminophen. Also, has No Known Allergies.  ROS  Objective: BP 140/80   Pulse 88   Ht 5\' 7"  (1.702 m)   Wt 243 lb (110.2 kg)   BMI 38.06 kg/m  OBGyn Exam  Female chaperone present for pelvic portion of the physical exam  Assessment: 36 y.o. [id number] with Endometriosis.  1. Endometriosis  2. Chronic pelvic pain in female  Problem List Items Addressed This Visit      Other   Endometriosis - Primary   Chronic pelvic pain in female   Relevant Medications   oxyCODONE-acetaminophen (ROXICET) 5-325 MG tablet    NSAIDs.  Percocet temporary, not for chronic use. May benefit from dx lap, vs hysterectomy for pelvic pain and endometriosis tx. Lupron also an option. Pt to investigate insurance possibilities.  [facility] a reasonable option as well since she was seen there in past.  Annamarie Major, MD, [facility], [facility] 10/11/2016  2:57 PM

## 2016-10-11 NOTE — Patient Instructions (Signed)
Endometriosis Endometriosis is a condition in which the tissue that lines the uterus (endometrium) grows outside of its normal location. The tissue may grow in many locations close to the uterus, but it commonly grows on the ovaries, fallopian tubes, vagina, or bowel. When the uterus sheds the endometrium every menstrual cycle, there is bleeding wherever the endometrial tissue is located. This can cause pain because blood is irritating to tissues that are not normally exposed to it. What are the causes? The cause of endometriosis is not known. What increases the risk? You may be more likely to develop endometriosis if you:  Have a family history of endometriosis.  Have never given birth.  Started your period at age 10 or younger.  Have high levels of estrogen in your body.  Were exposed to a certain medicine (diethylstilbestrol) before you were born (in utero).  Had low birth weight.  Were born as a twin, triplet, or other multiple.  Have a BMI of less than 25. BMI is an estimate of body fat and is calculated from height and weight.  What are the signs or symptoms? Often, there are no symptoms of this condition. If you do have symptoms, they may:  Vary depending on where your endometrial tissue is growing.  Occur during your menstrual period (most common) or midcycle.  Come and go, or you may go months with no symptoms at all.  Stop with menopause.  Symptoms may include:  Pain in the back or abdomen.  Heavier bleeding during periods.  Pain during sex.  Painful bowel movements.  Infertility.  Pelvic pain.  Bleeding more than once a month.  How is this diagnosed? This condition is diagnosed based on your symptoms and a physical exam. You may have tests, such as:  Blood tests and urine tests. These may be done to help rule out other possible causes of your symptoms.  Ultrasound, to look for abnormal tissues.  An X-ray of the lower bowel (barium enema).  An  ultrasound that is done through the vagina (transvaginally).  CT scan.  MRI.  Laparoscopy. In this procedure, a lighted, pencil-sized instrument called a laparoscope is inserted into your abdomen through an incision. The laparoscope allows your health care provider to look at the organs inside your body and check for abnormal tissue to confirm the diagnosis. If abnormal tissue is found, your health care provider may remove a small piece of tissue (biopsy) to be examined under a microscope.  How is this treated? Treatment for this condition may include:  Medicines to relieve pain, such as NSAIDs.  Hormone therapy. This involves using artificial (synthetic) hormones to reduce endometrial tissue growth. Your health care provider may recommend using a hormonal form of birth control, or other medicines.  Surgery. This may be done to remove abnormal endometrial tissue. ? In some cases, tissue may be removed using a laparoscope and a laser (laparoscopic laser treatment). ? In severe cases, surgery may be done to remove the fallopian tubes, uterus, and ovaries (hysterectomy).  Follow these instructions at home:  Take over-the-counter and prescription medicines only as told by your health care provider.  Do not drive or use heavy machinery while taking prescription pain medicine.  Try to avoid activities that cause pain, including sexual activity.  Keep all follow-up visits as told by your health care provider. This is important. Contact a health care provider if:  You have pain in the area between your hip bones (pelvic area) that occurs: ? Before, during, or   after your period. ? In between your period and gets worse during your period. ? During or after sex. ? With bowel movements or urination, especially during your period.  You have problems getting pregnant.  You have a fever. Get help right away if:  You have severe pain that does not get better with medicine.  You have severe  nausea and vomiting, or you cannot eat without vomiting.  You have pain that affects only the lower, right side of your abdomen.  You have abdominal pain that gets worse.  You have abdominal swelling.  You have blood in your stool. This information is not intended to replace advice given to you by your health care provider. Make sure you discuss any questions you have with your health care provider. Document Released: 02/04/2000 Document Revised: 11/12/2015 Document Reviewed: 07/10/2015 Elsevier Interactive Patient Education  2018 [employer]  

## 2016-10-18 ENCOUNTER — Encounter: Payer: Self-pay | Admitting: Obstetrics & Gynecology

## 2017-07-27 ENCOUNTER — Encounter: Payer: Self-pay | Admitting: Obstetrics and Gynecology

## 2017-07-27 ENCOUNTER — Ambulatory Visit (INDEPENDENT_AMBULATORY_CARE_PROVIDER_SITE_OTHER): Payer: Medicaid Other | Admitting: Obstetrics and Gynecology

## 2017-07-27 VITALS — BP 140/86 | HR 102 | Ht 67.0 in | Wt 232.0 lb

## 2017-07-27 DIAGNOSIS — N809 Endometriosis, unspecified: Secondary | ICD-10-CM

## 2017-07-27 DIAGNOSIS — R102 Pelvic and perineal pain: Secondary | ICD-10-CM | POA: Diagnosis not present

## 2017-07-27 MED ORDER — MEDROXYPROGESTERONE ACETATE 150 MG/ML IM SUSP
150.0000 mg | INTRAMUSCULAR | 3 refills | Status: DC
Start: 2017-07-27 — End: 2020-02-21

## 2017-07-27 NOTE — Progress Notes (Signed)
Endometriosis and pain Was diagnosed with endometriosis in 2014 and had a surgery to break up the adhesions. She reports her pain was better for 6 months after this then returned  Depo prescription sent Review medical records.   She reports

## 2017-07-30 ENCOUNTER — Ambulatory Visit: Payer: Medicaid Other

## 2017-07-31 ENCOUNTER — Ambulatory Visit: Payer: Medicaid Other

## 2017-08-13 ENCOUNTER — Ambulatory Visit: Payer: Medicaid Other | Admitting: Obstetrics and Gynecology

## 2017-09-29 ENCOUNTER — Encounter: Payer: Self-pay | Admitting: *Deleted

## 2017-09-29 ENCOUNTER — Emergency Department
Admission: EM | Admit: 2017-09-29 | Discharge: 2017-09-29 | Disposition: A | Discharge status: Home / Self Care | Payer: Medicaid Other | Attending: Emergency Medicine | Admitting: Emergency Medicine

## 2017-09-29 ENCOUNTER — Other Ambulatory Visit: Payer: Self-pay

## 2017-09-29 ENCOUNTER — Emergency Department: Payer: Medicaid Other

## 2017-09-29 DIAGNOSIS — R1013 Epigastric pain: Secondary | ICD-10-CM

## 2017-09-29 DIAGNOSIS — I10 Essential (primary) hypertension: Secondary | ICD-10-CM | POA: Insufficient documentation

## 2017-09-29 DIAGNOSIS — K2900 Acute gastritis without bleeding: Secondary | ICD-10-CM | POA: Diagnosis not present

## 2017-09-29 DIAGNOSIS — F17210 Nicotine dependence, cigarettes, uncomplicated: Secondary | ICD-10-CM | POA: Diagnosis not present

## 2017-09-29 LAB — CBC
HCT: 27 % — ABNORMAL LOW (ref 35.0–47.0)
Hemoglobin: 8.4 g/dL — ABNORMAL LOW (ref 12.0–16.0)
MCH: 19.1 pg — ABNORMAL LOW (ref 26.0–34.0)
MCHC: 31.1 g/dL — ABNORMAL LOW (ref 32.0–36.0)
MCV: 61.3 fL — ABNORMAL LOW (ref 80.0–100.0)
Platelets: 401 10*3/uL (ref 150–440)
RBC: 4.41 MIL/uL (ref 3.80–5.20)
RDW: 19.5 % — ABNORMAL HIGH (ref 11.5–14.5)
WBC: 8.3 10*3/uL (ref 3.6–11.0)

## 2017-09-29 LAB — COMPREHENSIVE METABOLIC PANEL
ALT: 8 U/L (ref 0–44)
AST: 15 U/L (ref 15–41)
Albumin: 3.7 g/dL (ref 3.5–5.0)
Alkaline Phosphatase: 63 U/L (ref 38–126)
Anion gap: 7 (ref 5–15)
BUN: 8 mg/dL (ref 6–20)
CO2: 25 mmol/L (ref 22–32)
Calcium: 8.8 mg/dL — ABNORMAL LOW (ref 8.9–10.3)
Chloride: 109 mmol/L (ref 98–111)
Creatinine, Ser: 0.78 mg/dL (ref 0.44–1.00)
GFR calc Af Amer: >60 mL/min (ref >60)
GFR calc non Af Amer: >60 mL/min (ref >60)
Glucose, Bld: 107 mg/dL — ABNORMAL HIGH (ref 70–99)
Potassium: 3.2 mmol/L — ABNORMAL LOW (ref 3.5–5.1)
Sodium: 141 mmol/L (ref 135–145)
Total Bilirubin: 0.2 mg/dL — ABNORMAL LOW (ref 0.3–1.2)
Total Protein: 7 g/dL (ref 6.5–8.1)

## 2017-09-29 LAB — LIPASE, BLOOD: Lipase: 23 U/L (ref 11–51)

## 2017-09-29 LAB — ETHANOL: Alcohol, Ethyl (B): 12 mg/dL — ABNORMAL HIGH (ref <10)

## 2017-09-29 MED ORDER — FENTANYL CITRATE (PF) 100 MCG/2ML IJ SOLN
50.0000 ug | Freq: Once | INTRAMUSCULAR | Status: AC
  Administered 2017-09-29: 50 ug via INTRAVENOUS
  Filled 2017-09-29: qty 2

## 2017-09-29 MED ORDER — FAMOTIDINE IN NACL 20-0.9 MG/50ML-% IV SOLN
20.0000 mg | Freq: Once | INTRAVENOUS | Status: AC
  Administered 2017-09-29: 20 mg via INTRAVENOUS
  Filled 2017-09-29: qty 50

## 2017-09-29 MED ORDER — ONDANSETRON 4 MG PO TBDP: 4.0000 mg | ORAL_TABLET | Freq: Three times a day (TID) | ORAL | 0 refills | Status: DC | PRN

## 2017-09-29 MED ORDER — SODIUM CHLORIDE 0.9 % IV BOLUS
1000.0000 mL | Freq: Once | INTRAVENOUS | Status: AC
  Administered 2017-09-29: 1000 mL via INTRAVENOUS

## 2017-09-29 MED ORDER — FAMOTIDINE 20 MG PO TABS: 20.0000 mg | ORAL_TABLET | Freq: Two times a day (BID) | ORAL | 0 refills | Status: DC

## 2017-09-29 MED ORDER — ONDANSETRON HCL 4 MG/2ML IJ SOLN
4.0000 mg | Freq: Once | INTRAMUSCULAR | Status: AC
  Administered 2017-09-29: 4 mg via INTRAVENOUS
  Filled 2017-09-29: qty 2

## 2017-09-29 NOTE — ED Notes (Signed)
Pt stated that she could not provider urine sample at this time.

## 2017-09-29 NOTE — Discharge Instructions (Addendum)
1.  Start Pepcid 20 mg twice daily. 2.  You may take Zofran as needed for nausea/vomiting. 3.  Eat a bland diet for the next 5 days, then slowly advance diet as tolerated. 4.  Return to the ER for worsening symptoms, persistent vomiting, difficulty breathing or other concerns.

## 2017-09-29 NOTE — ED Notes (Signed)
Pt stated that she still could not provide urine sample.

## 2017-09-29 NOTE — ED Notes (Signed)
Pt is back from ultrasound.

## 2017-09-29 NOTE — ED Notes (Signed)
Pt is going to ultrasound.   

## 2017-09-29 NOTE — ED Provider Notes (Signed)
[facility] Emergency Department Provider Note   ____________________________________________   First MD Initiated Contact with Patient 09/29/17 0500     (approximate)  I have reviewed the triage vital signs and the nursing notes.   HISTORY  Chief Complaint Abdominal Pain    HPI Elizabeth Gould is a 37 y.o. female who presents to the ED from home with a chief complaint of epigastric pain.  Patient had not yet gone to bed when she felt the pain approximately 20 minutes prior to arrival.  Complains of sharp epigastric pain radiating to her left shoulder.  Denies alcohol use tonight.  Denies fever, chills, chest pain, shortness of breath, nausea, vomiting, diarrhea.  Denies recent travel or trauma.   Past Medical History:  Diagnosis Date  . Endometriosis   . Hypertension     Patient Active Problem List   Diagnosis Date Noted  . Endometriosis 10/11/2016  . Chronic pelvic pain in female 10/11/2016    History reviewed. No pertinent surgical history.  Prior to Admission medications   Medication Sig Start Date End Date Taking? Authorizing Provider  famotidine (PEPCID) 20 MG tablet Take 1 tablet (20 mg total) by mouth 2 (two) times daily. 09/29/17   Irean HongSung, Chan Rosasco J, MD  hydrochlorothiazide (HYDRODIURIL) 25 MG tablet TK 1 T PO  QD. MAKE APPT 05/25/17   [provider]  medroxyPROGESTERone (DEPO-PROVERA) 150 MG/ML injection Inject 1 mL (150 mg total) into the muscle every 3 (three) months. 07/27/17 10/25/17  Natale MilchSchuman, Christanna R, MD  omeprazole (PRILOSEC OTC) 20 MG tablet Take by mouth. 04/12/17 04/12/18  [provider]  ondansetron (ZOFRAN ODT) 4 MG disintegrating tablet Take 1 tablet (4 mg total) by mouth every 8 (eight) hours as needed for nausea or vomiting. 09/29/17   Irean HongSung, Enrigue Hashimi J, MD    Allergies Patient has no known allergies.  Family History  Problem Relation Age of Onset  . Hypertension Mother   . Diabetes Paternal Grandmother   .  Diabetes Paternal Grandfather     Social History Social History   Tobacco Use  . Smoking status: Current Every Day Smoker    Packs/day: 0.50    Types: Cigarettes  . Smokeless tobacco: Never Used  Substance Use Topics  . Alcohol use: No  . Drug use: No    Review of Systems  Constitutional: No fever/chills Eyes: No visual changes. ENT: No sore throat. Cardiovascular: Denies chest pain. Respiratory: Denies shortness of breath. Gastrointestinal: Positive for abdominal pain.  No nausea, no vomiting.  No diarrhea.  No constipation. Genitourinary: Negative for dysuria. Musculoskeletal: Negative for back pain. Skin: Negative for rash. Neurological: Negative for headaches, focal weakness or numbness.   ____________________________________________   PHYSICAL EXAM:  VITAL SIGNS: ED Triage Vitals  Enc Vitals Group     BP 09/29/17 0450 (!) 145/109     Pulse Rate 09/29/17 0450 (!) 107     Resp 09/29/17 0450 16     Temp 09/29/17 0450 98.2 F (36.8 C)     Temp Source 09/29/17 0450 Oral     SpO2 09/29/17 0450 100 %     Weight 09/29/17 0457 215 lb (97.5 kg)     Height 09/29/17 0457 5\' 7"  (1.702 m)     Head Circumference --      Peak Flow --      Pain Score 09/29/17 0456 9     Pain Loc --      Pain Edu? --      Excl.  in GC? --     Constitutional: Alert and oriented. Well appearing and in mild acute distress. Eyes: Conjunctivae are normal. PERRL. EOMI. Head: Atraumatic. Nose: No congestion/rhinnorhea. Mouth/Throat: Mucous membranes are moist.  Oropharynx non-erythematous. Neck: No stridor.   Cardiovascular: Normal rate, regular rhythm. Grossly normal heart sounds.  Good peripheral circulation. Respiratory: Normal respiratory effort.  No retractions. Lungs CTAB. Gastrointestinal: Soft and mildly tender to palpation epigastrium without rebound or guarding. No distention. No abdominal bruits. No CVA tenderness. Musculoskeletal: No lower extremity tenderness nor edema.  No  joint effusions. Neurologic:  Normal speech and language. No gross focal neurologic deficits are appreciated. No gait instability. Skin:  Skin is warm, dry and intact. No rash noted. Psychiatric: Mood and affect are normal. Speech and behavior are normal.  ____________________________________________   LABS (all labs ordered are listed, but only abnormal results are displayed)  Labs Reviewed  COMPREHENSIVE METABOLIC PANEL - Abnormal; Notable for the following components:      Result Value   Potassium 3.2 (*)    Glucose, Bld 107 (*)    Calcium 8.8 (*)    Total Bilirubin 0.2 (*)    All other components within normal limits  CBC - Abnormal; Notable for the following components:   Hemoglobin 8.4 (*)    HCT 27.0 (*)    MCV 61.3 (*)    MCH 19.1 (*)    MCHC 31.1 (*)    RDW 19.5 (*)    All other components within normal limits  ETHANOL - Abnormal; Notable for the following components:   Alcohol, Ethyl (B) 12 (*)    All other components within normal limits  LIPASE, BLOOD  URINALYSIS, COMPLETE (UACMP) WITH MICROSCOPIC  POC URINE PREG, ED   ____________________________________________  EKG  ED ECG REPORT I, Lynnix Schoneman J, the attending physician, personally viewed and interpreted this ECG.   Date: 09/29/2017  EKG Time: 0457  Rate: 110  Rhythm: sinus tachycardia  Axis: Normal  Intervals:none  ST&T Change: Nonspecific  ____________________________________________  RADIOLOGY  ED MD interpretation: Unremarkable abdominal ultrasound  Official radiology report(s): US Abdomen Limited Ruq  Result Date: 09/29/2017 CLINICAL DATA:  Acute onset of epigastric abdominal pain. EXAM: ULTRASOUND ABDOMEN LIMITED RIGHT UPPER QUADRANT COMPARISON:  CT of the abdomen and pelvis from 02/08/2016 FINDINGS: Gallbladder: No gallstones or wall thickening visualized. No sonographic Murphy sign noted by sonographer. Common bile duct: Diameter: 0.4 cm, within normal limits in caliber. Liver: No focal  lesion identified. Increased parenchymal echogenicity likely reflects fatty infiltration. Portal vein is patent on color Doppler imaging with normal direction of blood flow towards the liver. IMPRESSION: 1. No acute abnormality seen at the right upper quadrant. 2. Mild fatty infiltration within the liver. Electronically Signed   By: Roanna Raider M.D.   On: 09/29/2017 06:30    ____________________________________________   PROCEDURES  Procedure(s) performed: None  Procedures  Critical Care performed: No  ____________________________________________   INITIAL IMPRESSION / ASSESSMENT AND PLAN / ED COURSE  As part of my medical decision making, I reviewed the following data within the electronic MEDICAL RECORD NUMBER Nursing notes reviewed and incorporated, Labs reviewed, Old chart reviewed, Radiograph reviewed  and Notes from prior ED visits   37 year old female who presents with epigastric pain. Differential diagnosis includes, but is not limited to, biliary disease (biliary colic, acute cholecystitis, cholangitis, choledocholithiasis, etc), intrathoracic causes for epigastric abdominal pain including ACS, gastritis, duodenitis, pancreatitis, small bowel or large bowel obstruction, abdominal aortic aneurysm, hernia, and ulcer(s).  Will obtain  screening lab work including LFTs and lipase.  Administer 50 mcg IV fentanyl for pain paired with 4 mg IV Zofran for nausea.  Proceed with right upper quadrant abdominal ultrasound to evaluate for cholecystitis.  Clinical Course as of Sep 29 645  Sat Sep 29, 2017  0600 Laboratory results noted.  Noted stable anemia.  Noted EtOH.   [JS]  [id number] Patient is feeling significantly better after IV fentanyl, Zofran, Pepcid and IV fluids.  Updated her on all laboratory and imaging results.  Will discharge home on Pepcid, Zofran as needed and she will follow-up with her PCP next week.  Strict return precautions given.  Patient verbalizes understanding and agrees  with plan of care.   [JS]    Clinical Course User Index [JS] Irean Hong, MD     ____________________________________________   FINAL CLINICAL IMPRESSION(S) / ED DIAGNOSES  Final diagnoses:  Epigastric pain  Acute gastritis without hemorrhage, unspecified gastritis type     ED Discharge Orders         Ordered    famotidine (PEPCID) 20 MG tablet  2 times daily     09/29/17 0646    ondansetron (ZOFRAN ODT) 4 MG disintegrating tablet  Every 8 hours PRN     09/29/17 0646           Note:  This document was prepared using Dragon voice recognition software and may include unintentional dictation errors.    Irean Hong, MD 09/29/17 [phone]

## 2017-09-29 NOTE — ED Triage Notes (Signed)
Pt c/o epigastric pain radiating to L shoulder blade and L neck. Pt states pain woke her from sleep between 15-20 minutes prior to arrival. Pt denies n/v at this time. Pt denies dyspnea at this time.

## 2018-01-06 IMAGING — CT CT ABD-PELV W/ CM
2 of 7 series · 14 of 46 positions shown, 18 images · IV contrast (iopamidol)
Comparison: None.

CLINICAL DATA: Sudden onset abdominal pain 30 minutes prior to
arrival.

EXAM:
CT ABDOMEN AND PELVIS WITH CONTRAST
TECHNIQUE: Multidetector CT imaging of the abdomen and pelvis was performed
using the standard protocol following bolus administration of
intravenous contrast.
CONTRAST:  100mL 2UE1DA-K44 IOPAMIDOL (2UE1DA-K44) INJECTION 61%

[Series 2: routine abd/pel with · axial · 0.96mm/px · z∈[-1182,-792]mm · 11 of 92 slices shown, 15 images]
[im 9/92  soft-tissue]
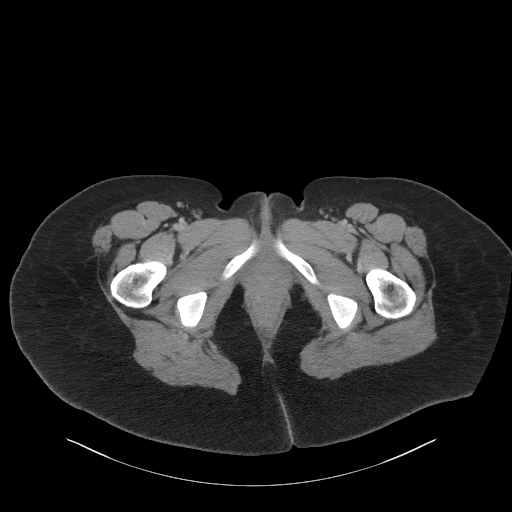
[im 9/92  bone]
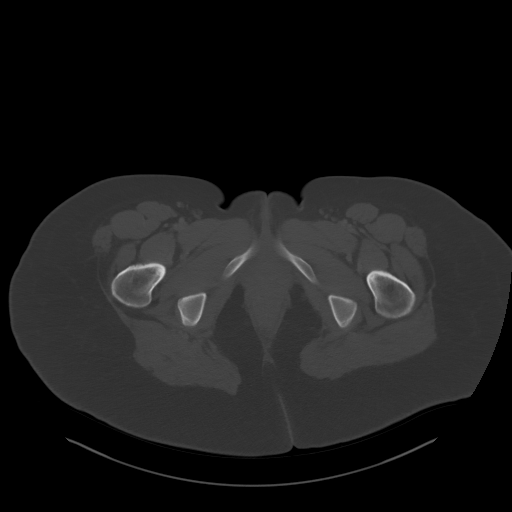
[im 18/92  soft-tissue]
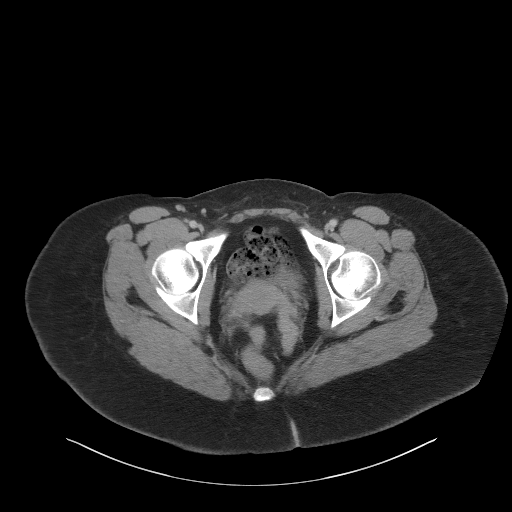
[im 27/92  soft-tissue]
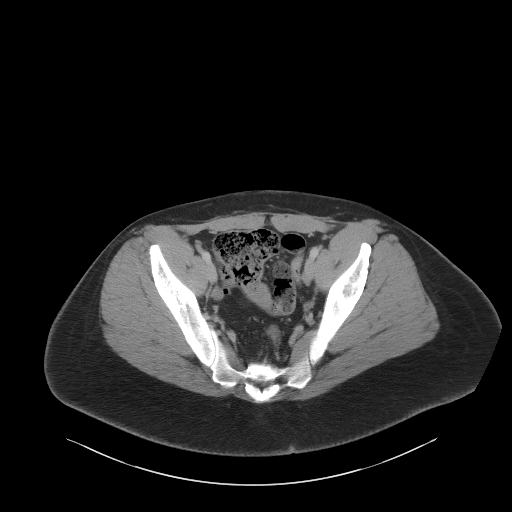
[im 35/92  soft-tissue]
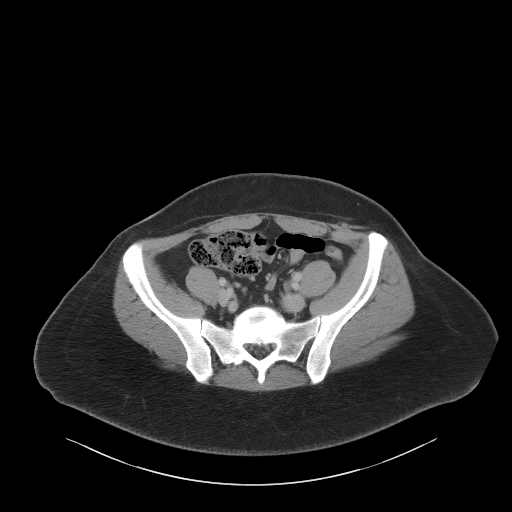
[im 48/92  soft-tissue]
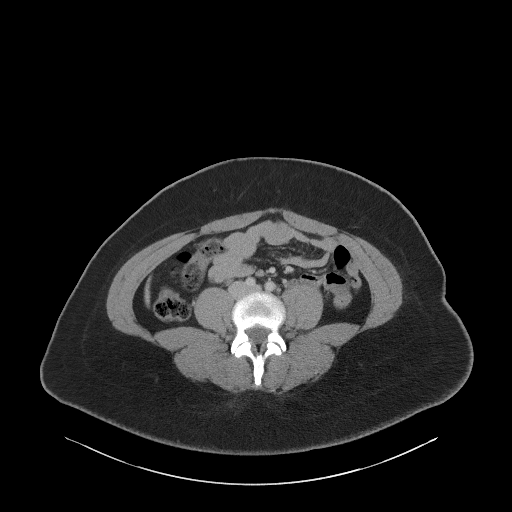
[im 57/92  soft-tissue]
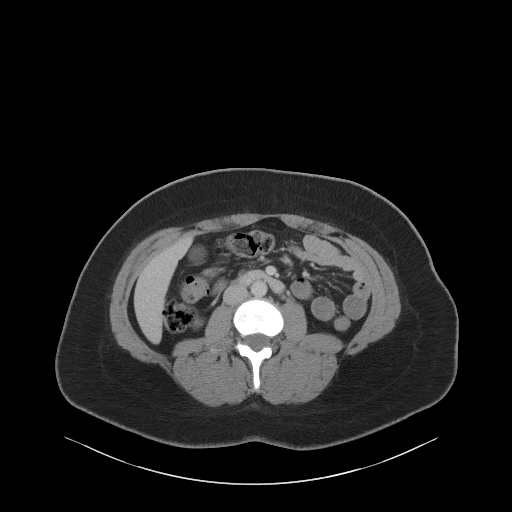
[im 66/92  soft-tissue]
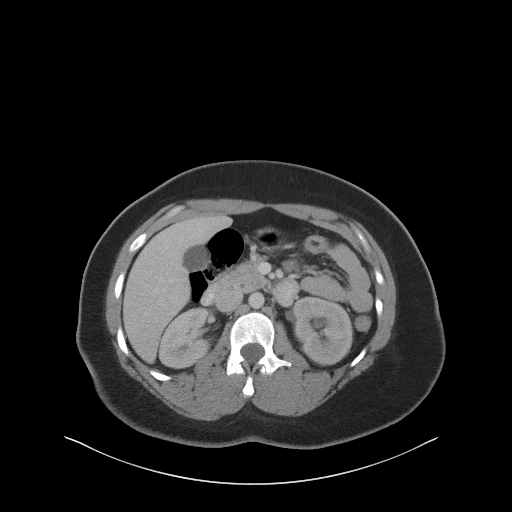
[im 74/92  soft-tissue]
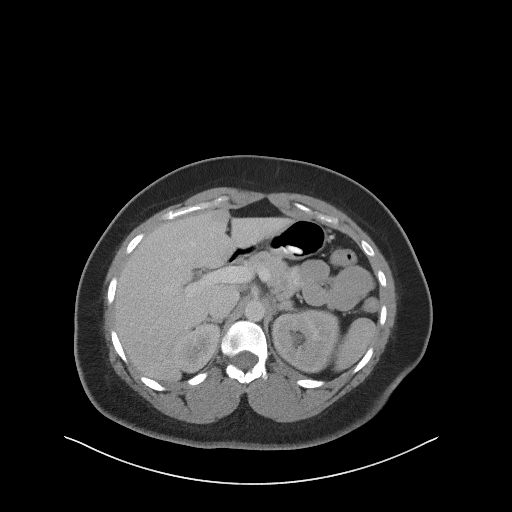
[im 74/92  lung]
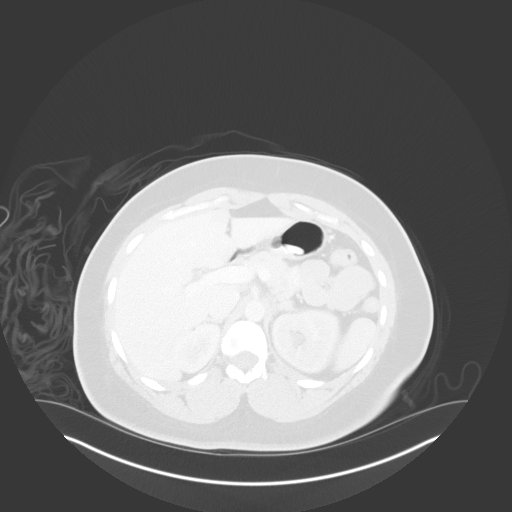
[im 79/92  lung]
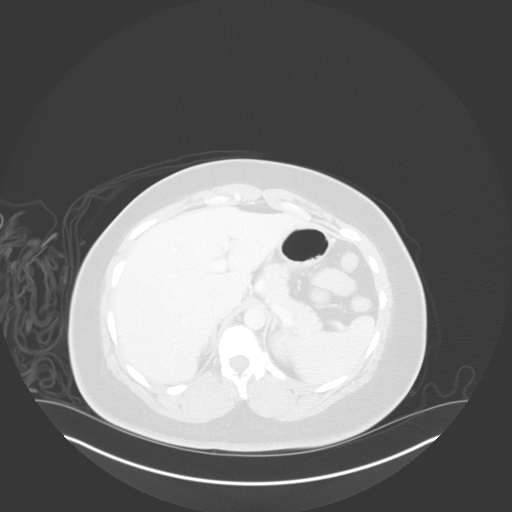
[im 83/92  soft-tissue]
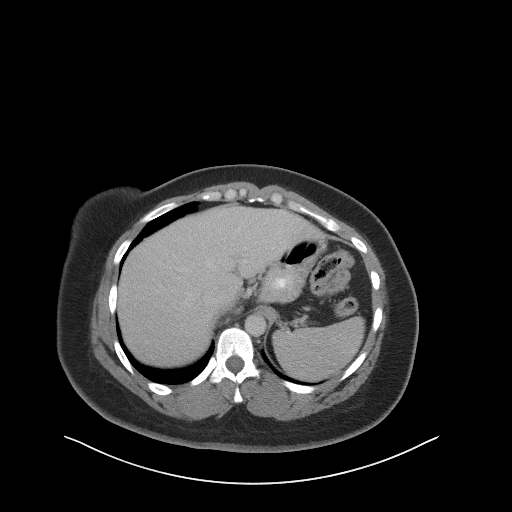
[im 83/92  lung]
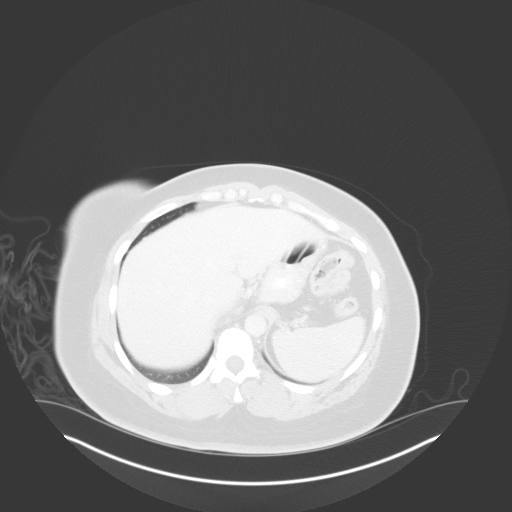
[im 83/92  bone]
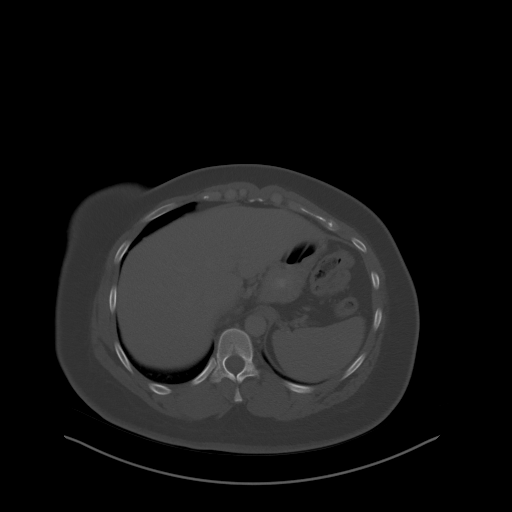
[im 87/92  lung]
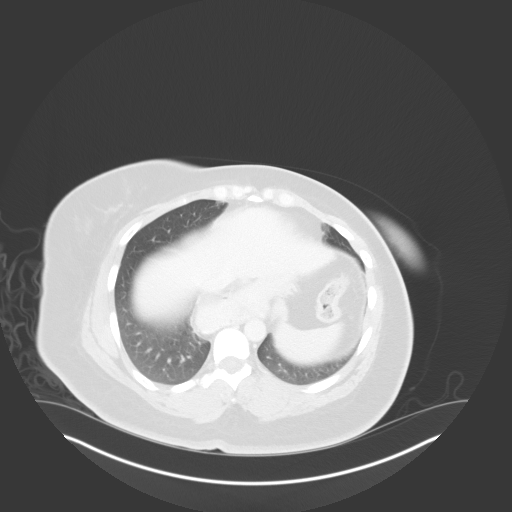

[Series 7: coronal st · coronal · 0.83mm/px · 3 of 86 slices shown]
[im 22/86  soft-tissue]
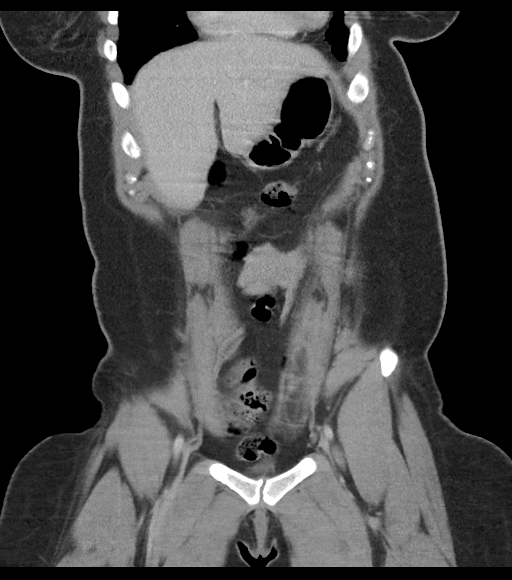
[im 43/86  soft-tissue]
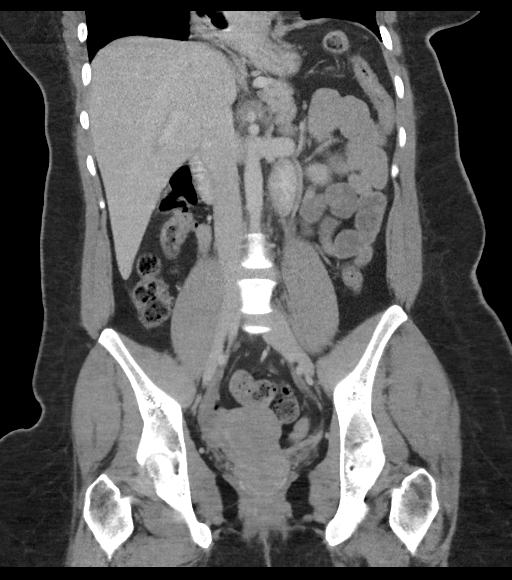
[im 64/86  soft-tissue]
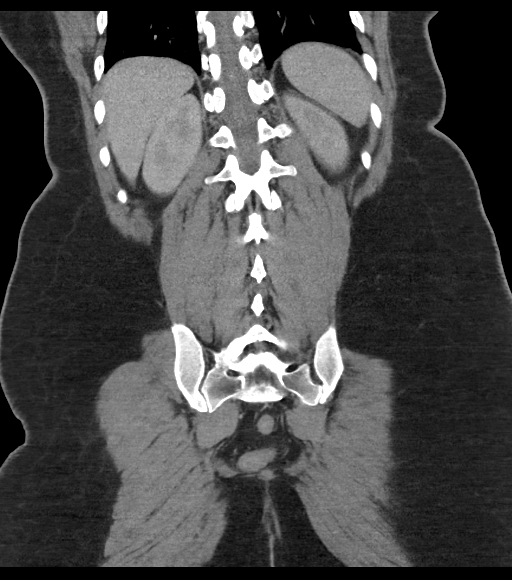

[14 of 46 positions shown; findings below may reference images not displayed]

FINDINGS: Lower chest: Large hiatal hernia. Lung bases are clear. No
effusions.

Hepatobiliary: No focal liver abnormality is seen. No gallstones,
gallbladder wall thickening, or biliary dilatation.

Pancreas: Unremarkable. No pancreatic ductal dilatation or
surrounding inflammatory changes.

Spleen: Normal in size without focal abnormality.

Adrenals/Urinary Tract: Both adrenals are normal. Right kidney and
ureter are normal.

Left hydronephrosis and hydroureter. Perinephric and periureteral
stranding. Punctate calcification may represent a 2 mm distal left
ureteral calculus, within 1 cm of the ureterovesical junction. Image
42 series 7.

Urinary bladder is nearly completely empty, grossly unremarkable.

Stomach/Bowel: Stomach is within normal limits. Appendix is normal.
No evidence of bowel wall thickening, distention, or inflammatory
changes.

Vascular/Lymphatic: No significant vascular findings are present. No
enlarged abdominal or pelvic lymph nodes.

Reproductive: Uterus and bilateral adnexa are unremarkable.

Other: No ascites.  Small fat containing umbilical hernia.

Musculoskeletal: No significant skeletal lesion.
IMPRESSION: Left hydronephrosis and hydroureter with perinephric and
periureteral stranding consistent with acute obstructive uropathy.
Probable 2 mm distal left ureteral calculus, 1 cm from the UVJ.

No other acute findings.

Hiatal hernia.  Small fat containing umbilical hernia.

## 2020-02-20 ENCOUNTER — Encounter: Payer: Self-pay | Admitting: Internal Medicine

## 2020-02-20 ENCOUNTER — Observation Stay: Payer: Medicaid Other

## 2020-02-20 ENCOUNTER — Inpatient Hospital Stay
Admission: EM | Admit: 2020-02-20 | Discharge: 2020-02-22 | DRG: 377 | Disposition: A | Discharge status: Home / Self Care | Payer: Medicaid Other | Attending: Internal Medicine | Admitting: Internal Medicine

## 2020-02-20 DIAGNOSIS — Z79899 Other long term (current) drug therapy: Secondary | ICD-10-CM

## 2020-02-20 DIAGNOSIS — K219 Gastro-esophageal reflux disease without esophagitis: Secondary | ICD-10-CM

## 2020-02-20 DIAGNOSIS — R112 Nausea with vomiting, unspecified: Secondary | ICD-10-CM

## 2020-02-20 DIAGNOSIS — K922 Gastrointestinal hemorrhage, unspecified: Secondary | ICD-10-CM | POA: Diagnosis present

## 2020-02-20 DIAGNOSIS — K920 Hematemesis: Secondary | ICD-10-CM | POA: Diagnosis not present

## 2020-02-20 DIAGNOSIS — K2901 Acute gastritis with bleeding: Secondary | ICD-10-CM

## 2020-02-20 DIAGNOSIS — I10 Essential (primary) hypertension: Secondary | ICD-10-CM | POA: Diagnosis present

## 2020-02-20 DIAGNOSIS — F17210 Nicotine dependence, cigarettes, uncomplicated: Secondary | ICD-10-CM | POA: Diagnosis present

## 2020-02-20 DIAGNOSIS — Z720 Tobacco use: Secondary | ICD-10-CM | POA: Diagnosis present

## 2020-02-20 DIAGNOSIS — U071 COVID-19: Secondary | ICD-10-CM | POA: Diagnosis present

## 2020-02-20 DIAGNOSIS — E876 Hypokalemia: Secondary | ICD-10-CM | POA: Diagnosis present

## 2020-02-20 DIAGNOSIS — R1013 Epigastric pain: Secondary | ICD-10-CM | POA: Diagnosis present

## 2020-02-20 LAB — CBC
HCT: 48.9 % — ABNORMAL HIGH (ref 36.0–46.0)
HCT: 47.4 % — ABNORMAL HIGH (ref 36.0–46.0)
HCT: 45.4 % (ref 36.0–46.0)
Hemoglobin: 16.2 g/dL — ABNORMAL HIGH (ref 12.0–15.0)
Hemoglobin: 15.3 g/dL — ABNORMAL HIGH (ref 12.0–15.0)
Hemoglobin: 14.7 g/dL (ref 12.0–15.0)
MCH: 29.2 pg (ref 26.0–34.0)
MCH: 29 pg (ref 26.0–34.0)
MCH: 28.9 pg (ref 26.0–34.0)
MCHC: 33.1 g/dL (ref 30.0–36.0)
MCHC: 32.3 g/dL (ref 30.0–36.0)
MCHC: 32.4 g/dL (ref 30.0–36.0)
MCV: 88.3 fL (ref 80.0–100.0)
MCV: 89.9 fL (ref 80.0–100.0)
MCV: 89.4 fL (ref 80.0–100.0)
Platelets: 256 10*3/uL (ref 150–400)
Platelets: 270 10*3/uL (ref 150–400)
Platelets: 262 10*3/uL (ref 150–400)
RBC: 5.54 MIL/uL — ABNORMAL HIGH (ref 3.87–5.11)
RBC: 5.27 MIL/uL — ABNORMAL HIGH (ref 3.87–5.11)
RBC: 5.08 MIL/uL (ref 3.87–5.11)
RDW: 12.4 % (ref 11.5–15.5)
RDW: 12.4 % (ref 11.5–15.5)
RDW: 12.5 % (ref 11.5–15.5)
WBC: 6.6 10*3/uL (ref 4.0–10.5)
WBC: 5 10*3/uL (ref 4.0–10.5)
WBC: 6.2 10*3/uL (ref 4.0–10.5)
nRBC: 0 % (ref 0.0–0.2)
nRBC: 0 % (ref 0.0–0.2)
nRBC: 0 % (ref 0.0–0.2)

## 2020-02-20 LAB — COMPREHENSIVE METABOLIC PANEL
ALT: 17 U/L (ref 0–44)
AST: 22 U/L (ref 15–41)
Albumin: 4.3 g/dL (ref 3.5–5.0)
Alkaline Phosphatase: 48 U/L (ref 38–126)
Anion gap: 11 (ref 5–15)
BUN: 12 mg/dL (ref 6–20)
CO2: 25 mmol/L (ref 22–32)
Calcium: 9.7 mg/dL (ref 8.9–10.3)
Chloride: 104 mmol/L (ref 98–111)
Creatinine, Ser: 0.67 mg/dL (ref 0.44–1.00)
GFR, Estimated: >60 mL/min (ref >60)
Glucose, Bld: 128 mg/dL — ABNORMAL HIGH (ref 70–99)
Potassium: 4.2 mmol/L (ref 3.5–5.1)
Sodium: 140 mmol/L (ref 135–145)
Total Bilirubin: 0.5 mg/dL (ref 0.3–1.2)
Total Protein: 7.7 g/dL (ref 6.5–8.1)

## 2020-02-20 LAB — URINE DRUG SCREEN, QUALITATIVE (ARMC ONLY)
Amphetamines, Ur Screen: NOT DETECTED
Barbiturates, Ur Screen: NOT DETECTED
Benzodiazepine, Ur Scrn: NOT DETECTED
Cannabinoid 50 Ng, Ur ~~LOC~~: NOT DETECTED
Cocaine Metabolite,Ur ~~LOC~~: NOT DETECTED
MDMA (Ecstasy)Ur Screen: NOT DETECTED
Methadone Scn, Ur: NOT DETECTED
Opiate, Ur Screen: NOT DETECTED
Phencyclidine (PCP) Ur S: NOT DETECTED
Tricyclic, Ur Screen: NOT DETECTED

## 2020-02-20 LAB — URINALYSIS, COMPLETE (UACMP) WITH MICROSCOPIC
Bacteria, UA: NONE SEEN
Bilirubin Urine: NEGATIVE
Glucose, UA: NEGATIVE mg/dL
Hgb urine dipstick: NEGATIVE
Ketones, ur: 20 mg/dL — AB
Leukocytes,Ua: NEGATIVE
Nitrite: NEGATIVE
Protein, ur: >=300 mg/dL — AB
Specific Gravity, Urine: 1.032 — ABNORMAL HIGH (ref 1.005–1.030)
pH: 6 (ref 5.0–8.0)

## 2020-02-20 LAB — TYPE AND SCREEN
ABO/RH(D): B POS
Antibody Screen: NEGATIVE

## 2020-02-20 LAB — LIPASE, BLOOD: Lipase: 17 U/L (ref 11–51)

## 2020-02-20 LAB — PREGNANCY, URINE: Preg Test, Ur: NEGATIVE

## 2020-02-20 LAB — PROTIME-INR
INR: 1 (ref 0.8–1.2)
Prothrombin Time: 12.5 seconds (ref 11.4–15.2)

## 2020-02-20 LAB — APTT: aPTT: 28 seconds (ref 24–36)

## 2020-02-20 MED ORDER — NICOTINE 21 MG/24HR TD PT24
21.0000 mg | MEDICATED_PATCH | TRANSDERMAL | Status: DC
  Administered 2020-02-20 – 2020-02-21 (×2): 21 mg via TRANSDERMAL
  Filled 2020-02-20 (×2): qty 1

## 2020-02-20 MED ORDER — ACETAMINOPHEN 325 MG PO TABS
650.0000 mg | ORAL_TABLET | Freq: Four times a day (QID) | ORAL | Status: DC | PRN
  Administered 2020-02-22: 650 mg via ORAL
  Filled 2020-02-20: qty 2

## 2020-02-20 MED ORDER — PROMETHAZINE HCL 25 MG/ML IJ SOLN
25.0000 mg | Freq: Once | INTRAMUSCULAR | Status: AC
  Administered 2020-02-20: 25 mg via INTRAVENOUS
  Filled 2020-02-20: qty 1

## 2020-02-20 MED ORDER — PANTOPRAZOLE SODIUM 40 MG IV SOLR
8.0000 mg/h | INTRAVENOUS | Status: DC
  Filled 2020-02-20 (×2): qty 80

## 2020-02-20 MED ORDER — PANTOPRAZOLE SODIUM 40 MG IV SOLR
40.0000 mg | Freq: Two times a day (BID) | INTRAVENOUS | Status: DC
Start: 2020-02-24 — End: 2020-02-20

## 2020-02-20 MED ORDER — ONDANSETRON HCL 4 MG/2ML IJ SOLN
INTRAMUSCULAR | Status: AC
  Administered 2020-02-20: 4 mg via INTRAVENOUS
  Filled 2020-02-20: qty 2

## 2020-02-20 MED ORDER — PANTOPRAZOLE SODIUM 40 MG IV SOLR
40.0000 mg | Freq: Once | INTRAVENOUS | Status: AC
  Administered 2020-02-20: 40 mg via INTRAVENOUS
  Filled 2020-02-20: qty 40

## 2020-02-20 MED ORDER — SODIUM CHLORIDE 0.9 % IV SOLN
1000.0000 mL | Freq: Once | INTRAVENOUS | Status: AC
  Administered 2020-02-20: 1000 mL via INTRAVENOUS

## 2020-02-20 MED ORDER — HYDROMORPHONE HCL 1 MG/ML IJ SOLN
1.0000 mg | INTRAMUSCULAR | Status: DC | PRN
  Administered 2020-02-20 – 2020-02-22 (×6): 1 mg via INTRAVENOUS
  Filled 2020-02-20 (×6): qty 1

## 2020-02-20 MED ORDER — SODIUM CHLORIDE 0.9 % IV SOLN: INTRAVENOUS | Status: DC

## 2020-02-20 MED ORDER — PANTOPRAZOLE SODIUM 40 MG IV SOLR: 40.0000 mg | Freq: Two times a day (BID) | INTRAVENOUS | Status: DC

## 2020-02-20 MED ORDER — IOHEXOL 300 MG/ML  SOLN
100.0000 mL | Freq: Once | INTRAMUSCULAR | Status: AC | PRN
  Administered 2020-02-20: 100 mL via INTRAVENOUS

## 2020-02-20 MED ORDER — ONDANSETRON HCL 4 MG/2ML IJ SOLN
4.0000 mg | Freq: Once | INTRAMUSCULAR | Status: AC
Start: 2020-02-20 — End: 2020-02-20
  Administered 2020-02-20: 4 mg via INTRAVENOUS
  Filled 2020-02-20: qty 2

## 2020-02-20 MED ORDER — METOCLOPRAMIDE HCL 5 MG/ML IJ SOLN
5.0000 mg | Freq: Three times a day (TID) | INTRAMUSCULAR | Status: DC
  Administered 2020-02-20 – 2020-02-22 (×5): 5 mg via INTRAVENOUS
  Filled 2020-02-20 (×6): qty 2

## 2020-02-20 MED ORDER — ONDANSETRON HCL 4 MG/2ML IJ SOLN
4.0000 mg | Freq: Three times a day (TID) | INTRAMUSCULAR | Status: DC | PRN
  Administered 2020-02-20 – 2020-02-21 (×2): 4 mg via INTRAVENOUS
  Filled 2020-02-20 (×2): qty 2

## 2020-02-20 MED ORDER — HYDRALAZINE HCL 20 MG/ML IJ SOLN: 5.0000 mg | INTRAMUSCULAR | Status: DC | PRN

## 2020-02-20 MED ORDER — ONDANSETRON HCL 4 MG/2ML IJ SOLN: 4.0000 mg | Freq: Once | INTRAMUSCULAR | Status: AC

## 2020-02-20 NOTE — ED Notes (Addendum)
Pt weak. Resp reg/unlabored. Pt slow to respond but A&Ox4. Vomiting small amounts currently. Hard stick. Skin dry. Pt in thick pajamas but requesting blanket bc she is "cold". Room temp currently 70.

## 2020-02-20 NOTE — ED Provider Notes (Signed)
[facility] Emergency Department Provider Note   ____________________________________________    I have reviewed the triage vital signs and the nursing notes.   HISTORY  Chief Complaint Hematemesis and Abdominal Pain     HPI Elizabeth Gould is a 39 y.o. female who presents with complaints of epigastric nominal discomfort and vomiting.  She reports her vomitus has been dark but has not seen any overt blood.  Denies fevers or chills.  No sick contacts reported.  No recent alcohol use.  No recent ibuprofen use.  No history of PUD.  Has not take anything for this.  Symptoms started at 2 AM this morning.  Stools are normal  Past Medical History:  Diagnosis Date  . Endometriosis   . Hypertension     Patient Active Problem List   Diagnosis Date Noted  . Endometriosis 10/11/2016  . Chronic pelvic pain in female 10/11/2016    No past surgical history on file.  Prior to Admission medications   Medication Sig Start Date End Date Taking? Authorizing Provider  famotidine (PEPCID) 20 MG tablet Take 1 tablet (20 mg total) by mouth 2 (two) times daily. 09/29/17   Irean Hong, MD  hydrochlorothiazide (HYDRODIURIL) 25 MG tablet TK 1 T PO  QD. MAKE APPT 05/25/17   [provider]  medroxyPROGESTERone (DEPO-PROVERA) 150 MG/ML injection Inject 1 mL (150 mg total) into the muscle every 3 (three) months. 07/27/17 10/25/17  Natale Milch, MD  omeprazole (PRILOSEC OTC) 20 MG tablet Take by mouth. 04/12/17 04/12/18  [provider]  ondansetron (ZOFRAN ODT) 4 MG disintegrating tablet Take 1 tablet (4 mg total) by mouth every 8 (eight) hours as needed for nausea or vomiting. 09/29/17   Irean Hong, MD     Allergies Patient has no known allergies.  Family History  Problem Relation Age of Onset  . Hypertension Mother   . Diabetes Paternal Grandmother   . Diabetes Paternal Grandfather     Social History Social History   Tobacco Use  . Smoking  status: Current Every Day Smoker    Packs/day: 0.50    Types: Cigarettes  . Smokeless tobacco: Never Used  Vaping Use  . Vaping Use: Never used  Substance Use Topics  . Alcohol use: No  . Drug use: No    Review of Systems  Constitutional: No fever/chills Eyes: No visual changes.  ENT: No sore throat. Cardiovascular: Denies chest pain. Respiratory: Denies shortness of breath. Gastrointestinal: As above.   Genitourinary: Negative for dysuria. Musculoskeletal: Negative for back pain. Skin: Negative for rash. Neurological: Negative for headaches or weakness   ____________________________________________   PHYSICAL EXAM:  VITAL SIGNS: ED Triage Vitals  Enc Vitals Group     BP 02/20/20 1244 (!) 148/94     Pulse Rate 02/20/20 1244 70     Resp 02/20/20 1244 20     Temp 02/20/20 1334 98.6 F (37 C)     Temp Source 02/20/20 1334 Oral     SpO2 02/20/20 1244 100 %     Weight --      Height --      Head Circumference --      Peak Flow --      Pain Score 02/20/20 1041 10     Pain Loc --      Pain Edu? --      Excl. in GC? --     Constitutional: Alert and oriented  Nose: No congestion/rhinnorhea. Mouth/Throat: Mucous membranes are  moist.   Neck:  Painless ROM Cardiovascular: Normal rate, regular rhythm. Grossly normal heart sounds.  Good peripheral circulation. Respiratory: Normal respiratory effort.  No retractions. Lungs CTAB. Gastrointestinal: Mild epigastric tenderness, soft. No distention.  No CVA tenderness. Musculoskeletal: No lower extremity tenderness nor edema.  Warm and well perfused Neurologic:  Normal speech and language. No gross focal neurologic deficits are appreciated.  Skin:  Skin is warm, dry and intact. No rash noted. Psychiatric: Mood and affect are normal. Speech and behavior are normal.  ____________________________________________   LABS (all labs ordered are listed, but only abnormal results are displayed)  Labs Reviewed  COMPREHENSIVE  METABOLIC PANEL - Abnormal; Notable for the following components:      Result Value   Glucose, Bld 128 (*)    All other components within normal limits  CBC - Abnormal; Notable for the following components:   RBC 5.54 (*)    Hemoglobin 16.2 (*)    HCT 48.9 (*)    All other components within normal limits  SARS CORONAVIRUS 2 (TAT 6-24 HRS)  LIPASE, BLOOD  URINALYSIS, COMPLETE (UACMP) WITH MICROSCOPIC  TYPE AND SCREEN   ____________________________________________  EKG  ED ECG REPORT I, Jene Every, the attending physician, personally viewed and interpreted this ECG.  Date: 02/20/2020  Rhythm: normal sinus rhythm QRS Axis: normal Intervals: normal ST/T Wave abnormalities: normal Narrative Interpretation: no evidence of acute ischemia  ____________________________________________  RADIOLOGY   ____________________________________________   PROCEDURES  Procedure(s) performed: No  Procedures   Critical Care performed: No ____________________________________________   INITIAL IMPRESSION / ASSESSMENT AND PLAN / ED COURSE  Pertinent labs & imaging results that were available during my care of the patient were reviewed by me and considered in my medical decision making (see chart for details).  Patient with nausea and vomiting as detailed above, mild epigastric discomfort.  Differential includes gastritis, gastroenteritis, PUD  We will treat with IV Zofran, IV fluids, IV Protonix and reevaluate.  Lab work is overall quite reassuring, hemoglobin is normal  Witnessed patient vomited coffee-ground emesis  Additional antiemetics given, without significant improvement.  She will require admission for intractable nausea vomiting, likely PUD   ____________________________________________   FINAL CLINICAL IMPRESSION(S) / ED DIAGNOSES  Final diagnoses:  Hematemesis with nausea  Acute gastritis with hemorrhage, unspecified gastritis type        Note:  This  document was prepared using Dragon voice recognition software and may include unintentional dictation errors.   Jene Every, MD 02/20/20 1630

## 2020-02-20 NOTE — ED Triage Notes (Signed)
Pt presents via POV c/o hematemesis since this am. Also reports feeling weak and dizzy.

## 2020-02-20 NOTE — ED Notes (Signed)
Sent type and screen down with chart labels after multiple unsuccessful attempts to print labels through sunquest on multiple different computers.

## 2020-02-20 NOTE — ED Notes (Signed)
Spoke with Genia Hotter, LPN about IV clotted off.  Explained that IV team has been ordered but we are the last place they come.  She was agreeable for patient to be sent up without an IV.

## 2020-02-20 NOTE — Plan of Care (Signed)
  Problem: Education: Goal: Knowledge of General Education information will improve Description: Including pain rating scale, medication(s)/side effects and non-pharmacologic comfort measures Outcome: Progressing   Problem: Health Behavior/Discharge Planning: Goal: Ability to manage health-related needs will improve Outcome: Progressing   Problem: Clinical Measurements: Goal: Ability to maintain clinical measurements within normal limits will improve Outcome: Progressing Goal: Will remain free from infection Outcome: Progressing Goal: Diagnostic test results will improve Outcome: Progressing Goal: Respiratory complications will improve Outcome: Progressing Goal: Cardiovascular complication will be avoided Outcome: Progressing   Problem: Activity: Goal: Risk for activity intolerance will decrease Outcome: Progressing   Problem: Nutrition: Goal: Adequate nutrition will be maintained Outcome: Progressing   Problem: Coping: Goal: Level of anxiety will decrease Outcome: Progressing   Problem: Elimination: Goal: Will not experience complications related to bowel motility Outcome: Progressing Goal: Will not experience complications related to urinary retention Outcome: Progressing   Problem: Pain Managment: Goal: General experience of comfort will improve Outcome: Progressing   Problem: Skin Integrity: Goal: Risk for impaired skin integrity will decrease Outcome: Progressing   Problem: Education: Goal: Knowledge of disease or condition will improve Outcome: Progressing Goal: Knowledge of the prescribed therapeutic regimen will improve Outcome: Progressing   Problem: Bowel/Gastric: Goal: Occurences of nausea and/or vomiting will decrease Outcome: Progressing   Problem: Fluid Volume: Goal: Maintenance of adequate hydration will improve Outcome: Progressing   Problem: Nutritional: Goal: Achievement of adequate weight for body size and type will improve Outcome:  Progressing

## 2020-02-20 NOTE — H&P (Signed)
History and Physical    Elizabeth Gould AJO:[id number] DOB: August 19, 1980 DOA: 02/20/2020  Referring MD/NP/PA:   PCP: Patient, No Pcp Per   Patient coming from:  The patient is coming from home.  At baseline, pt is independent for most of ADL.        Chief Complaint: Intractable nausea vomiting with coffee-ground emesis and abdominal pain  HPI: Elizabeth Gould is a 39 y.o. female with medical history significant of HTN, GERD, tobacco abuse, endometriosis presents with intractable nausea vomiting with coffee-ground emesis, and abdominal pain.    Patient states that her symptoms started last night.  She has been having intractable nausea, vomiting with coffee-ground emesis.  Patient also has epigastric abdominal pain, which is constant, sharp, 8 out of 10 severity, nonradiating.  No diarrhea.  No bloody stool. She has weakness and lightheadedness.  No symptoms of UTI.  No unilateral numbness or tingling in extremities. No history of PUD, no alcohol abuse or recent ibuprofen use.   ED Course: pt was found to have hemoglobin 16.2, lipase 17, WBC 6.6, pending Covid PCR, pending pregnancy test, electrolytes renal function okay, temperature normal, blood pressure 149/82, heart rate 72, RR 20, oxygen saturation 95% on room air.  Patient is placed on MedSurg bed for observation  Review of Systems:   General: no fevers, chills, no body weight gain, has poor appetite, has fatigue HEENT: no blurry vision, hearing changes or sore throat Respiratory: no dyspnea, coughing, wheezing CV: no chest pain, no palpitations GI: has nausea, vomiting, abdominal pain, hematemesis, no diarrhea, constipation GU: no dysuria, burning on urination, increased urinary frequency, hematuria  Ext: no leg edema Neuro: no unilateral weakness, numbness, or tingling, no vision change or hearing loss Skin: no rash, no skin tear. MSK: No muscle spasm, no deformity, no limitation of range of movement in spin Heme: No easy  bruising.  Travel history: No recent long distant travel.  Allergy: No Known Allergies  Past Medical History:  Diagnosis Date  . Endometriosis   . Hypertension     Past Surgical History:  Procedure Laterality Date  . CESAREAN SECTION      Social History:  reports that she has been smoking cigarettes. She has been smoking about 0.50 packs per day. She has never used smokeless tobacco. She reports that she does not drink alcohol and does not use drugs.  Family History:  Family History  Problem Relation Age of Onset  . Hypertension Mother   . Diabetes Paternal Grandmother   . Diabetes Paternal Grandfather      Prior to Admission medications   Medication Sig Start Date End Date Taking? Authorizing Provider  famotidine (PEPCID) 20 MG tablet Take 1 tablet (20 mg total) by mouth 2 (two) times daily. 09/29/17   Irean Hong, MD  hydrochlorothiazide (HYDRODIURIL) 25 MG tablet TK 1 T PO  QD. MAKE APPT 05/25/17   [provider]  medroxyPROGESTERone (DEPO-PROVERA) 150 MG/ML injection Inject 1 mL (150 mg total) into the muscle every 3 (three) months. 07/27/17 10/25/17  Natale Milch, MD  omeprazole (PRILOSEC OTC) 20 MG tablet Take by mouth. 04/12/17 04/12/18  [provider]  ondansetron (ZOFRAN ODT) 4 MG disintegrating tablet Take 1 tablet (4 mg total) by mouth every 8 (eight) hours as needed for nausea or vomiting. 09/29/17   Irean Hong, MD    Physical Exam: Vitals:   02/20/20 1545 02/20/20 1600 02/20/20 1615 02/20/20 1630  BP:  (!) 155/90  (!) 135/122  Pulse:  75 84 (!) 125 95  Resp:      Temp:      TempSrc:      SpO2: 94% 92% 100% 95%   General: Not in acute distress HEENT:       Eyes: PERRL, EOMI, no scleral icterus.       ENT: No discharge from the ears and nose, no pharynx injection, no tonsillar enlargement.        Neck: No JVD, no bruit, no mass felt. Heme: No neck lymph node enlargement. Cardiac: S1/S2, RRR, No murmurs, No gallops or  rubs. Respiratory: No rales, wheezing, rhonchi or rubs. GI: Soft, nondistended, has tenderness in epigastric area, no rebound pain, no organomegaly, BS present. GU: No hematuria Ext: No pitting leg edema bilaterally. 2+DP/PT pulse bilaterally. Musculoskeletal: No joint deformities, No joint redness or warmth, no limitation of ROM in spin. Skin: No rashes.  Neuro: Alert, oriented X3, cranial nerves II-XII grossly intact, moves all extremities normally.  Psych: Patient is not psychotic, no suicidal or hemocidal ideation.  Labs on Admission: I have personally reviewed following labs and imaging studies  CBC: Recent Labs  Lab 02/20/20 1056  WBC 6.6  HGB 16.2*  HCT 48.9*  MCV 88.3  PLT 256   Basic Metabolic Panel: Recent Labs  Lab 02/20/20 1056  NA 140  K 4.2  CL 104  CO2 25  GLUCOSE 128*  BUN 12  CREATININE 0.67  CALCIUM 9.7   GFR: CrCl cannot be calculated (Unknown ideal weight.). Liver Function Tests: Recent Labs  Lab 02/20/20 1056  AST 22  ALT 17  ALKPHOS 48  BILITOT 0.5  PROT 7.7  ALBUMIN 4.3   Recent Labs  Lab 02/20/20 1056  LIPASE 17   No results for input(s): AMMONIA in the last 168 hours. Coagulation Profile: No results for input(s): INR, PROTIME in the last 168 hours. Cardiac Enzymes: No results for input(s): CKTOTAL, CKMB, CKMBINDEX, TROPONINI in the last 168 hours. BNP (last 3 results) No results for input(s): PROBNP in the last 8760 hours. HbA1C: No results for input(s): HGBA1C in the last 72 hours. CBG: No results for input(s): GLUCAP in the last 168 hours. Lipid Profile: No results for input(s): CHOL, HDL, LDLCALC, TRIG, CHOLHDL, LDLDIRECT in the last 72 hours. Thyroid Function Tests: No results for input(s): TSH, T4TOTAL, FREET4, T3FREE, THYROIDAB in the last 72 hours. Anemia Panel: No results for input(s): VITAMINB12, FOLATE, FERRITIN, TIBC, IRON, RETICCTPCT in the last 72 hours. Urine analysis:    Component Value Date/Time    COLORURINE AMBER (A) 09/10/2016 1740   APPEARANCEUR HAZY (A) 09/10/2016 1740   APPEARANCEUR Cloudy 06/01/2014 1616   LABSPEC 1.029 09/10/2016 1740   LABSPEC 1.027 06/01/2014 1616   PHURINE 6.0 09/10/2016 1740   GLUCOSEU NEGATIVE 09/10/2016 1740   GLUCOSEU Negative 06/01/2014 1616   HGBUR MODERATE (A) 09/10/2016 1740   BILIRUBINUR NEGATIVE 09/10/2016 1740   BILIRUBINUR Negative 06/01/2014 1616   KETONESUR 5 (A) 09/10/2016 1740   PROTEINUR 100 (A) 09/10/2016 1740   NITRITE NEGATIVE 09/10/2016 1740   LEUKOCYTESUR NEGATIVE 09/10/2016 1740   LEUKOCYTESUR Negative 06/01/2014 1616   Sepsis Labs: @LABRCNTIP (procalcitonin:4,lacticidven:4) )No results found for this or any previous visit (from the past 240 hour(s)).   Radiological Exams on Admission: No results found.   EKG: I have personally reviewed.  Sinus rhythm, QTC 439, low voltage, early R wave progression  Assessment/Plan Principal Problem:   Coffee ground emesis Active Problems:   Intractable nausea and vomiting   Hypertension  GERD (gastroesophageal reflux disease)   Tobacco abuse   Epigastric abdominal pain   Intractable nausea and vomiting with coffee ground emesis and epigastric abdominal pain: Etiology is not clear.  Lipase normal.  Differential diagnosis includes acute viral gastritis, marijuana use and Mallory Weiss syndrome.  Hemoglobin stable 16.2.  Currently hemodynamically stable.  [facility] of GI is consulted, he will see patient in the morning since hemoglobin is stable   - will place in med-surg bed obs - Reglan 5 mg every 8 hour - As needed Zofran - Current Dilaudid for abdominal pain - NPO  - IVF: 1L NS bolus, then at 125 mL/hr - Start IV pantoprazole gtt - Avoid NSAIDs and SQ heparin - Maintain IV access (2 large bore IVs if possible). - Monitor closely and follow q6h cbc, transfuse as necessary, if Hgb<7.0 - LaB: INR, PTT and type screen -Follow-up CT of abdomen/pelvis  Hypertension: -IV  hydralazine as needed -Hold HCTZ while patient is n.p.o.  GERD (gastroesophageal reflux disease) -On Protonix IV  Tobacco abuse -Nicotine patch   DVT ppx: SCD Code Status: Full code Family Communication: not done, no family member is at bed side.    Disposition Plan:  Anticipate discharge back to previous environment Consults called:  Dr. Mia Creek of GI Admission status: Med-surg bed for obs   Status is: Observation  The patient remains OBS appropriate and will d/c before 2 midnights.  Dispo: The patient is from: Home              Anticipated d/c is to: Home              Anticipated d/c date is: 1 day              Patient currently is not medically stable to d/c.          Date of Service 02/20/2020    Lorretta Harp [facility]   If 7PM-7AM, please contact night-coverage [email or web address] 02/20/2020, 5:59 PM

## 2020-02-21 ENCOUNTER — Encounter: Payer: Self-pay | Admitting: Internal Medicine

## 2020-02-21 ENCOUNTER — Other Ambulatory Visit: Payer: Self-pay

## 2020-02-21 DIAGNOSIS — F17210 Nicotine dependence, cigarettes, uncomplicated: Secondary | ICD-10-CM | POA: Diagnosis present

## 2020-02-21 DIAGNOSIS — K920 Hematemesis: Secondary | ICD-10-CM | POA: Diagnosis present

## 2020-02-21 DIAGNOSIS — K922 Gastrointestinal hemorrhage, unspecified: Secondary | ICD-10-CM | POA: Diagnosis present

## 2020-02-21 DIAGNOSIS — E876 Hypokalemia: Secondary | ICD-10-CM

## 2020-02-21 DIAGNOSIS — U071 COVID-19: Secondary | ICD-10-CM | POA: Diagnosis present

## 2020-02-21 DIAGNOSIS — I10 Essential (primary) hypertension: Secondary | ICD-10-CM | POA: Diagnosis present

## 2020-02-21 DIAGNOSIS — K2901 Acute gastritis with bleeding: Secondary | ICD-10-CM | POA: Diagnosis present

## 2020-02-21 DIAGNOSIS — K219 Gastro-esophageal reflux disease without esophagitis: Secondary | ICD-10-CM | POA: Diagnosis present

## 2020-02-21 DIAGNOSIS — Z79899 Other long term (current) drug therapy: Secondary | ICD-10-CM | POA: Diagnosis not present

## 2020-02-21 LAB — GLUCOSE, CAPILLARY: Glucose-Capillary: 75 mg/dL (ref 70–99)

## 2020-02-21 LAB — BASIC METABOLIC PANEL
Anion gap: 10 (ref 5–15)
BUN: 9 mg/dL (ref 6–20)
CO2: 25 mmol/L (ref 22–32)
Calcium: 8.7 mg/dL — ABNORMAL LOW (ref 8.9–10.3)
Chloride: 108 mmol/L (ref 98–111)
Creatinine, Ser: 0.74 mg/dL (ref 0.44–1.00)
GFR, Estimated: >60 mL/min (ref >60)
Glucose, Bld: 82 mg/dL (ref 70–99)
Potassium: 3.3 mmol/L — ABNORMAL LOW (ref 3.5–5.1)
Sodium: 143 mmol/L (ref 135–145)

## 2020-02-21 LAB — CBC
HCT: 46.1 % — ABNORMAL HIGH (ref 36.0–46.0)
HCT: 42.3 % (ref 36.0–46.0)
Hemoglobin: 14.6 g/dL (ref 12.0–15.0)
Hemoglobin: 13.6 g/dL (ref 12.0–15.0)
MCH: 28.8 pg (ref 26.0–34.0)
MCH: 28.7 pg (ref 26.0–34.0)
MCHC: 31.7 g/dL (ref 30.0–36.0)
MCHC: 32.2 g/dL (ref 30.0–36.0)
MCV: 90.9 fL (ref 80.0–100.0)
MCV: 89.2 fL (ref 80.0–100.0)
Platelets: 234 10*3/uL (ref 150–400)
Platelets: 240 10*3/uL (ref 150–400)
RBC: 5.07 MIL/uL (ref 3.87–5.11)
RBC: 4.74 MIL/uL (ref 3.87–5.11)
RDW: 12.8 % (ref 11.5–15.5)
RDW: 12.6 % (ref 11.5–15.5)
WBC: 7 10*3/uL (ref 4.0–10.5)
WBC: 6 10*3/uL (ref 4.0–10.5)
nRBC: 0 % (ref 0.0–0.2)
nRBC: 0 % (ref 0.0–0.2)

## 2020-02-21 LAB — HIV ANTIBODY (ROUTINE TESTING W REFLEX): HIV Screen 4th Generation wRfx: NONREACTIVE

## 2020-02-21 LAB — SARS CORONAVIRUS 2 (TAT 6-24 HRS): SARS Coronavirus 2: POSITIVE — AB

## 2020-02-21 MED ORDER — HYDROCHLOROTHIAZIDE 25 MG PO TABS
25.0000 mg | ORAL_TABLET | Freq: Every day | ORAL | Status: DC
  Administered 2020-02-21 – 2020-02-22 (×2): 25 mg via ORAL
  Filled 2020-02-21 (×2): qty 1

## 2020-02-21 MED ORDER — POTASSIUM CHLORIDE CRYS ER 20 MEQ PO TBCR
40.0000 meq | EXTENDED_RELEASE_TABLET | Freq: Once | ORAL | Status: AC
  Administered 2020-02-21: 11:00:00 40 meq via ORAL
  Filled 2020-02-21: qty 2

## 2020-02-21 NOTE — Progress Notes (Signed)
PROGRESS NOTE    Elizabeth Gould  KWI:[id number] DOB: [birth year] DOA: 02/20/2020 PCP: Patient, No Pcp Per   Assessment & Plan:   Principal Problem:   Coffee ground emesis Active Problems:   Intractable nausea and vomiting   Hypertension   GERD (gastroesophageal reflux disease)   Tobacco abuse   Epigastric abdominal pain  Possible GI hemorrhage: etiology unclear, PUD vs gastiritis vs Mallory Weiss tear. Blatchford score of 0, so low risk for a lesion requiring EGD as per GI. Continue on IV PPI.  Avoid NSAIDs. CT abd/pelvis shows no acute process demonstrated in the abd or pelvis. H&H are WNL but will continue to monitor   COVID19 infection: asymptomatic. No need for treatment currently. Continue on airborne and contact precautions   Hypokalemia: KCl repleted. Will continue to monitor  HTN: will restart home dose of HCTZ. IV hydralazine prn  GERD: continue on PPI   Tobacco abuse: nicotine patch to prevent w/drawal. Smoking cessation counseling   DVT prophylaxis: SCDs Code Status: full  Family Communication:  Disposition Plan: likely d/c back home   Status is: Observation  The patient remains OBS appropriate and will d/c before 2 midnights.  Dispo: The patient is from: Home              Anticipated d/c is to: Home              Anticipated d/c date is: 1 day              Patient currently is not medically stable to d/c.      Consultants:   GI   Procedures:   Antimicrobials:   Subjective: Pt c/o malaise   Objective: Vitals:   02/20/20 1824 02/20/20 2010 02/20/20 2346 02/21/20 0528  BP: (!) 158/104 (!) 145/84 (!) 147/75 136/85  Pulse: 75 83 84 81  Resp: 18 16 17 16   Temp: 98.5 F (36.9 C) 98 F (36.7 C) 98.1 F (36.7 C) 98.4 F (36.9 C)  TempSrc:   Oral   SpO2: 98% 96% 97% 99%    Intake/Output Summary (Last 24 hours) at 02/21/2020 04/20/2020 Last data filed at 02/20/2020 1900 Gross per 24 hour  Intake --  Output 400 ml  Net -400 ml   There were no  vitals filed for this visit.  Examination:  General exam: Appears calm and comfortable  Respiratory system: Clear to auscultation. Respiratory effort normal. Cardiovascular system: S1 & S2 +. No  rubs, gallops or clicks.  Gastrointestinal system: Abdomen is nondistended, soft and nontender.  Normal bowel sounds heard. Central nervous system: Alert and oriented. Moves all 4 extremities Psychiatry: Judgement and insight appear normal. Flat mood and affect     Data Reviewed: I have personally reviewed following labs and imaging studies  CBC: Recent Labs  Lab 02/20/20 1056 02/20/20 1900 02/20/20 2227 02/21/20 0535  WBC 6.6 5.0 6.2 7.0  HGB 16.2* 15.3* 14.7 14.6  HCT 48.9* 47.4* 45.4 46.1*  MCV 88.3 89.9 89.4 90.9  PLT 256 270 262 234   Basic Metabolic Panel: Recent Labs  Lab 02/20/20 1056 02/21/20 0535  NA 140 143  K 4.2 3.3*  CL 104 108  CO2 25 25  GLUCOSE 128* 82  BUN 12 9  CREATININE 0.67 0.74  CALCIUM 9.7 8.7*   GFR: CrCl cannot be calculated (Unknown ideal weight.). Liver Function Tests: Recent Labs  Lab 02/20/20 1056  AST 22  ALT 17  ALKPHOS 48  BILITOT 0.5  PROT 7.7  ALBUMIN 4.3  Recent Labs  Lab 02/20/20 1056  LIPASE 17   No results for input(s): AMMONIA in the last 168 hours. Coagulation Profile: Recent Labs  Lab 02/20/20 1900  INR 1.0   Cardiac Enzymes: No results for input(s): CKTOTAL, CKMB, CKMBINDEX, TROPONINI in the last 168 hours. BNP (last 3 results) No results for input(s): PROBNP in the last 8760 hours. HbA1C: No results for input(s): HGBA1C in the last 72 hours. CBG: No results for input(s): GLUCAP in the last 168 hours. Lipid Profile: No results for input(s): CHOL, HDL, LDLCALC, TRIG, CHOLHDL, LDLDIRECT in the last 72 hours. Thyroid Function Tests: No results for input(s): TSH, T4TOTAL, FREET4, T3FREE, THYROIDAB in the last 72 hours. Anemia Panel: No results for input(s): VITAMINB12, FOLATE, FERRITIN, TIBC, IRON,  RETICCTPCT in the last 72 hours. Sepsis Labs: No results for input(s): PROCALCITON, LATICACIDVEN in the last 168 hours.  Recent Results (from the past 240 hour(s))  SARS CORONAVIRUS 2 (TAT 6-24 HRS) Nasopharyngeal Nasopharyngeal Swab     Status: Abnormal   Collection Time: 02/20/20  4:48 PM   Specimen: Nasopharyngeal Swab  Result Value Ref Range Status   SARS Coronavirus 2 POSITIVE (A) NEGATIVE Final    Comment: RESULT CALLED TO, READ BACK BY AND VERIFIED WITH: Lenard Simmer, RN 02/21/20 [place] (NOTE) SARS-CoV-2 target nucleic acids are DETECTED.  The SARS-CoV-2 RNA is generally detectable in upper and lower respiratory specimens during the acute phase of infection. Positive results are indicative of the presence of SARS-CoV-2 RNA. Clinical correlation with patient history and other diagnostic information is  necessary to determine patient infection status. Positive results do not rule out bacterial infection or co-infection with other viruses.  The expected result is Negative.  Fact Sheet for Patients: SugarRoll.be  Fact Sheet for Healthcare Providers: https://www.woods-mathews.com/  This test is not yet approved or cleared by the [place] FDA and  has been authorized for detection and/or diagnosis of SARS-CoV-2 by FDA under an Emergency Use Authorization (EUA). This EUA will remain  in effect (meaning this test can be used ) for the duration of the COVID-19 declaration under Section 564(b)(1) of the Act, 21 U.S.C. section 360bbb-3(b)(1), unless the authorization is terminated or revoked sooner.   Performed at [facility], [place] [address], [place], [place] [address]          Radiology Studies: CT ABDOMEN PELVIS W CONTRAST  Result Date: 02/20/2020 CLINICAL DATA:  Nausea and vomiting.  Weakness and dizziness. EXAM: CT ABDOMEN AND PELVIS WITH CONTRAST TECHNIQUE: Multidetector CT imaging of the abdomen and pelvis  was performed using the standard protocol following bolus administration of intravenous contrast. CONTRAST:  155mL OMNIPAQUE IOHEXOL 300 MG/ML  SOLN COMPARISON:  02/08/2016 FINDINGS: Lower chest: The lung bases are clear. Hepatobiliary: No focal liver abnormality is seen. No gallstones, gallbladder wall thickening, or biliary dilatation. Pancreas: Unremarkable. No pancreatic ductal dilatation or surrounding inflammatory changes. Spleen: Normal in size without focal abnormality. Adrenals/Urinary Tract: Adrenal glands are unremarkable. Kidneys are normal, without renal calculi, focal lesion, or hydronephrosis. Bladder is unremarkable. Stomach/Bowel: Stomach is within normal limits. Appendix appears normal. No evidence of bowel wall thickening, distention, or inflammatory changes. Vascular/Lymphatic: No significant vascular findings are present. No enlarged abdominal or pelvic lymph nodes. Reproductive: Uterus and bilateral adnexa are unremarkable. Other: No abdominal wall hernia or abnormality. No abdominopelvic ascites. Musculoskeletal: No acute or significant osseous findings. IMPRESSION: No acute process demonstrated in the abdomen or pelvis. No evidence of bowel obstruction or inflammation. Electronically Signed  By: Burman Nieves M.D.   On: 02/20/2020 22:10        Scheduled Meds: . metoCLOPramide (REGLAN) injection  5 mg Intravenous Q8H  . nicotine  21 mg Transdermal Q24H  . [START ON 02/26/2020] pantoprazole  40 mg Intravenous Q12H   Continuous Infusions: . sodium chloride    . pantoprozole (PROTONIX) infusion       LOS: 0 days    Time spent: 31 mins    Charise Killian, MD [facility] Pager [phone]  If 7PM-7AM, please contact night-coverage 02/21/2020, 7:28 AM

## 2020-02-21 NOTE — Consult Note (Signed)
Consultation  Referring Provider:   Dr. Clyde Lundborg  Admit date: 02/20/2020 Consult date: 12/31         Reason for Consultation:     Coffee ground emesis         HPI:   Elizabeth Gould is a 40 y.o. lady with past medical history of hypertension who presented with nausea, vomiting, and epigastric pain. Dark emesis but no blood, melena, or hematochezia. Vital signs at presentation were normal and hemoglobin was normal. Patient with no evidence of liver disease and she does not take NSAIDS or blood thinners. Patient tested positive for COVID on presentation. She states she was taking a PPI prior to admission but was out of it for a few days.  Past Medical History:  Diagnosis Date  . Endometriosis   . Hypertension     Past Surgical History:  Procedure Laterality Date  . CESAREAN SECTION      Family History  Problem Relation Age of Onset  . Hypertension Mother   . Diabetes Paternal Grandmother   . Diabetes Paternal Grandfather      Social History   Tobacco Use  . Smoking status: Current Every Day Smoker    Packs/day: 0.50    Types: Cigarettes  . Smokeless tobacco: Never Used  Vaping Use  . Vaping Use: Never used  Substance Use Topics  . Alcohol use: No  . Drug use: No    Prior to Admission medications   Medication Sig Start Date End Date Taking? Authorizing Provider  ondansetron (ZOFRAN ODT) 4 MG disintegrating tablet Take 1 tablet (4 mg total) by mouth every 8 (eight) hours as needed for nausea or vomiting. 09/29/17  Yes Irean Hong, MD  famotidine (PEPCID) 20 MG tablet Take 1 tablet (20 mg total) by mouth 2 (two) times daily. 09/29/17   Irean Hong, MD  hydrochlorothiazide (HYDRODIURIL) 25 MG tablet TK 1 T PO  QD. MAKE APPT 05/25/17   [provider]  medroxyPROGESTERone (DEPO-PROVERA) 150 MG/ML injection Inject 1 mL (150 mg total) into the muscle every 3 (three) months. 07/27/17 10/25/17  Natale Milch, MD  omeprazole (PRILOSEC OTC) 20 MG tablet Take by mouth.  04/12/17 04/12/18  [provider]    Current Facility-Administered Medications  Medication Dose Route Frequency Provider Last Rate Last Admin  . acetaminophen (TYLENOL) tablet 650 mg  650 mg Oral Q6H PRN Lorretta Harp, MD      . hydrALAZINE (APRESOLINE) injection 5 mg  5 mg Intravenous Q2H PRN Lorretta Harp, MD      . HYDROmorphone (DILAUDID) injection 1 mg  1 mg Intravenous Q4H PRN Lorretta Harp, MD   1 mg at 02/21/20 0509  . metoCLOPramide (REGLAN) injection 5 mg  5 mg Intravenous Q8H Lorretta Harp, MD   5 mg at 02/21/20 0803  . nicotine (NICODERM CQ - dosed in mg/24 hours) patch 21 mg  21 mg Transdermal Q24H Lorretta Harp, MD   21 mg at 02/20/20 1721  . ondansetron (ZOFRAN) injection 4 mg  4 mg Intravenous Q8H PRN Lorretta Harp, MD   4 mg at 02/21/20 0509  . pantoprazole (PROTONIX) 80 mg in sodium chloride 0.9 % 100 mL (0.8 mg/mL) infusion  8 mg/hr Intravenous Continuous Lorretta Harp, MD      . Melene Muller ON 02/26/2020] pantoprazole (PROTONIX) injection 40 mg  40 mg Intravenous Q12H Lorretta Harp, MD      . potassium chloride SA (KLOR-CON) CR tablet 40 mEq  40 mEq Oral Once Charise Killian,  MD        Allergies as of 02/20/2020  . (No Known Allergies)     Review of Systems:    All systems reviewed and negative except where noted in HPI.  Review of Systems  Constitutional: Negative for chills and fever.  Respiratory: Negative for shortness of breath.   Gastrointestinal: Positive for nausea and vomiting. Negative for abdominal pain, blood in stool, constipation, diarrhea and melena.  Musculoskeletal: Positive for joint pain.  Skin: Negative for itching and rash.  Neurological: Negative for focal weakness.  Psychiatric/Behavioral: Negative for substance abuse.  All other systems reviewed and are negative.     Physical Exam:  Vital signs in last 24 hours: Temp:  [98 F (36.7 C)-98.6 F (37 C)] 98.4 F (36.9 C) (01/01 0825) Pulse Rate:  [67-125] 86 (01/01 0825) Resp:  [16-20] 20 (01/01  0825) BP: (128-161)/(57-122) 154/77 (01/01 0825) SpO2:  [92 %-100 %] 99 % (01/01 0825) Last BM Date: 02/20/20 General:   Pleasant in NAD Head:  Normocephalic and atraumatic. Eyes:   No icterus.   Conjunctiva pink. Mouth: Mucosa pink moist, no lesions. Neck:  Supple; no masses felt Lungs:  No respiratory distress Heart:  S1S2, RRR, no MRG. No edema. Abdomen:   Flat, soft, nondistended, nontender.  Msk:  MAEW x4, No clubbing or cyanosis Neurologic:  Alert and  oriented x4;  Cranial nerves II-XII intact.  Skin:  Warm, dry, pink without significant lesions or rashes. Psych:  Alert and cooperative. Normal affect.  LAB RESULTS: Recent Labs    02/20/20 1900 02/20/20 2227 02/21/20 0535  WBC 5.0 6.2 7.0  HGB 15.3* 14.7 14.6  HCT 47.4* 45.4 46.1*  PLT 270 262 234   BMET Recent Labs    02/20/20 1056 02/21/20 0535  NA 140 143  K 4.2 3.3*  CL 104 108  CO2 25 25  GLUCOSE 128* 82  BUN 12 9  CREATININE 0.67 0.74  CALCIUM 9.7 8.7*   LFT Recent Labs    02/20/20 1056  PROT 7.7  ALBUMIN 4.3  AST 22  ALT 17  ALKPHOS 48  BILITOT 0.5   PT/INR Recent Labs    02/20/20 1900  LABPROT 12.5  INR 1.0    STUDIES: CT ABDOMEN PELVIS W CONTRAST  Result Date: 02/20/2020 CLINICAL DATA:  Nausea and vomiting.  Weakness and dizziness. EXAM: CT ABDOMEN AND PELVIS WITH CONTRAST TECHNIQUE: Multidetector CT imaging of the abdomen and pelvis was performed using the standard protocol following bolus administration of intravenous contrast. CONTRAST:  OMNIPAQUE IOHEXOL 300 MG/ML  SOLN COMPARISON:  02/08/2016 FINDINGS: Lower chest: The lung bases are clear. Hepatobiliary: No focal liver abnormality is seen. No gallstones, gallbladder wall thickening, or biliary dilatation. Pancreas: Unremarkable. No pancreatic ductal dilatation or surrounding inflammatory changes. Spleen: Normal in size without focal abnormality. Adrenals/Urinary Tract: Adrenal glands are unremarkable. Kidneys are normal,  without renal calculi, focal lesion, or hydronephrosis. Bladder is unremarkable. Stomach/Bowel: Stomach is within normal limits. Appendix appears normal. No evidence of bowel wall thickening, distention, or inflammatory changes. Vascular/Lymphatic: No significant vascular findings are present. No enlarged abdominal or pelvic lymph nodes. Reproductive: Uterus and bilateral adnexa are unremarkable. Other: No abdominal wall hernia or abnormality. No abdominopelvic ascites. Musculoskeletal: No acute or significant osseous findings. IMPRESSION: No acute process demonstrated in the abdomen or pelvis. No evidence of bowel obstruction or inflammation. Electronically Signed   By: Burman Nieves M.D.   On: 02/20/2020 22:10       Impression / Plan:  40 y/o lady with history of hypertension who presented with nausea/vomiting and questionable dark emesis but was found to have COVID. Consulted for dark emesis. Given blatchford score of 0 then patient at low risk for a lesion requiring endoscopic intervention.  - give PPI PO daily - no indication for any GI procedure - anti-emetics prn  We will sign-off at this time. Please call with any questions or concerns.  Raylene Miyamoto MD, MPH [facility]

## 2020-02-22 DIAGNOSIS — K920 Hematemesis: Secondary | ICD-10-CM | POA: Diagnosis not present

## 2020-02-22 DIAGNOSIS — U071 COVID-19: Secondary | ICD-10-CM | POA: Diagnosis not present

## 2020-02-22 DIAGNOSIS — I10 Essential (primary) hypertension: Secondary | ICD-10-CM | POA: Diagnosis not present

## 2020-02-22 LAB — CBC
HCT: 41.3 % (ref 36.0–46.0)
Hemoglobin: 13.3 g/dL (ref 12.0–15.0)
MCH: 28.9 pg (ref 26.0–34.0)
MCHC: 32.2 g/dL (ref 30.0–36.0)
MCV: 89.8 fL (ref 80.0–100.0)
Platelets: 229 10*3/uL (ref 150–400)
RBC: 4.6 MIL/uL (ref 3.87–5.11)
RDW: 12.5 % (ref 11.5–15.5)
WBC: 4.3 10*3/uL (ref 4.0–10.5)
nRBC: 0 % (ref 0.0–0.2)

## 2020-02-22 LAB — GLUCOSE, CAPILLARY: Glucose-Capillary: 70 mg/dL (ref 70–99)

## 2020-02-22 LAB — BASIC METABOLIC PANEL
Anion gap: 7 (ref 5–15)
BUN: 8 mg/dL (ref 6–20)
CO2: 26 mmol/L (ref 22–32)
Calcium: 8.7 mg/dL — ABNORMAL LOW (ref 8.9–10.3)
Chloride: 108 mmol/L (ref 98–111)
Creatinine, Ser: 0.6 mg/dL (ref 0.44–1.00)
GFR, Estimated: >60 mL/min (ref >60)
Glucose, Bld: 85 mg/dL (ref 70–99)
Potassium: 3.5 mmol/L (ref 3.5–5.1)
Sodium: 141 mmol/L (ref 135–145)

## 2020-02-22 MED ORDER — PANTOPRAZOLE SODIUM 40 MG PO TBEC: 40.0000 mg | DELAYED_RELEASE_TABLET | Freq: Every day | ORAL | 0 refills | Status: AC

## 2020-02-22 NOTE — Discharge Summary (Signed)
Physician Discharge Summary  Kemara Quigley ONG:[id number] DOB: Mar 05, 1980 DOA: 02/20/2020  PCP: Patient, No Pcp Per  Admit date: 02/20/2020 Discharge date: 02/22/2020  Admitted From: home Disposition:  home  Recommendations for Outpatient Follow-up:  1. Follow up with PCP in 2 weeks 2. Must quarantine for 5 days   Home Health: no  Equipment/Devices:  Discharge Condition: stable  CODE STATUS: full  Diet recommendation: Heart Healthy  Brief/Interim Summary: HPI was taken from Dr. Clyde Lundborg: Jerrine Urschel is a 40 y.o. female with medical history significant of HTN, GERD, tobacco abuse, endometriosis presents with intractable nausea vomiting with coffee-ground emesis, and abdominal pain.    Patient states that her symptoms started last night.  She has been having intractable nausea, vomiting with coffee-ground emesis.  Patient also has epigastric abdominal pain, which is constant, sharp, 8 out of 10 severity, nonradiating.  No diarrhea.  No bloody stool. She has weakness and lightheadedness.  No symptoms of UTI.  No unilateral numbness or tingling in extremities. No history of PUD, no alcohol abuse or recent ibuprofen use.   ED Course: pt was found to have hemoglobin 16.2, lipase 17, WBC 6.6, pending Covid PCR, pending pregnancy test, electrolytes renal function okay, temperature normal, blood pressure 149/82, heart rate 72, RR 20, oxygen saturation 95% on room air.  Patient is placed on MedSurg bed for observation  Hospital Course from Dr. Mayford Knife 1/1-02/22/20: Pt presented w/ coffee ground emesis of unknown etiology. H&H remained WNL throughout hospital stay. CT abd/pelvis was neg for any acute findings. Pt did receive PPI and was d/c w/ PPI as well. GI evaluated the pt and did not recommend any procedures. Pt did not have anymore coffee ground emesis inpatient. For more information, please see other progress/consult notes.   Discharge Diagnoses:  Principal Problem:   Coffee ground  emesis Active Problems:   Intractable nausea and vomiting   Hypertension   GERD (gastroesophageal reflux disease)   Tobacco abuse   Epigastric abdominal pain   GI bleed Possible GI hemorrhage: etiology unclear, PUD vs gastiritis vs Mallory Weiss tear. Blatchford score of 0, so low risk for a lesion requiring EGD as per GI. Continue on PPI.  Avoid NSAIDs. CT abd/pelvis shows no acute process demonstrated in the abd or pelvis. H&H are WNL but will continue to monitor   COVID19 infection: asymptomatic. No need for treatment currently. Continue on airborne and contact precautions   Hypokalemia: KCl repleted. Will continue to monitor  HTN: will restart home dose of HCTZ. IV hydralazine prn  GERD: continue on PPI   Tobacco abuse: nicotine patch to prevent w/drawal. Smoking cessation counseling    Discharge Instructions  Discharge Instructions    Diet - low sodium heart healthy   Complete by: As directed    Discharge instructions   Complete by: As directed    F/u PCP in 2 weeks. You must quarantine for 5 days for COVID19 infection.   Increase activity slowly   Complete by: As directed      Allergies as of 02/22/2020   No Known Allergies     Medication List    TAKE these medications   hydrochlorothiazide 25 MG tablet Commonly known as: HYDRODIURIL Take 25 mg by mouth daily.   losartan 25 MG tablet Commonly known as: COZAAR Take 25 mg by mouth daily.   pantoprazole 40 MG tablet Commonly known as: Protonix Take 1 tablet (40 mg total) by mouth daily.       No Known Allergies  Consultations:  GI, Dr. Mia Creek    Procedures/Studies: CT ABDOMEN PELVIS W CONTRAST  Result Date: 02/20/2020 CLINICAL DATA:  Nausea and vomiting.  Weakness and dizziness. EXAM: CT ABDOMEN AND PELVIS WITH CONTRAST TECHNIQUE: Multidetector CT imaging of the abdomen and pelvis was performed using the standard protocol following bolus administration of intravenous contrast. CONTRAST:   OMNIPAQUE IOHEXOL 300 MG/ML  SOLN COMPARISON:  02/08/2016 FINDINGS: Lower chest: The lung bases are clear. Hepatobiliary: No focal liver abnormality is seen. No gallstones, gallbladder wall thickening, or biliary dilatation. Pancreas: Unremarkable. No pancreatic ductal dilatation or surrounding inflammatory changes. Spleen: Normal in size without focal abnormality. Adrenals/Urinary Tract: Adrenal glands are unremarkable. Kidneys are normal, without renal calculi, focal lesion, or hydronephrosis. Bladder is unremarkable. Stomach/Bowel: Stomach is within normal limits. Appendix appears normal. No evidence of bowel wall thickening, distention, or inflammatory changes. Vascular/Lymphatic: No significant vascular findings are present. No enlarged abdominal or pelvic lymph nodes. Reproductive: Uterus and bilateral adnexa are unremarkable. Other: No abdominal wall hernia or abnormality. No abdominopelvic ascites. Musculoskeletal: No acute or significant osseous findings. IMPRESSION: No acute process demonstrated in the abdomen or pelvis. No evidence of bowel obstruction or inflammation. Electronically Signed   By: Burman Nieves M.D.   On: 02/20/2020 22:10     Subjective: Pt c/o fatigue    Discharge Exam: Vitals:   02/22/20 0606 02/22/20 0731  BP: 137/76 (!) 145/78  Pulse: 78 75  Resp: 16 16  Temp: 98.5 F (36.9 C)   SpO2: 97% 96%   Vitals:   02/21/20 2038 02/22/20 0022 02/22/20 0606 02/22/20 0731  BP: (!) 144/89 139/82 137/76 (!) 145/78  Pulse: 81 76 78 75  Resp: 16 16 16 16   Temp: 99.3 F (37.4 C) 98.9 F (37.2 C) 98.5 F (36.9 C)   TempSrc:    Oral  SpO2: 98% 96% 97% 96%    General: Pt is alert, awake, not in acute distress Cardiovascular: S1/S2 +, no rubs, no gallops Respiratory: CTA bilaterally, no wheezing, no rhonchi Abdominal: Soft, NT, obese, bowel sounds + Extremities: no cyanosis    The results of significant diagnostics from this hospitalization (including imaging,  microbiology, ancillary and laboratory) are listed below for reference.     Microbiology: Recent Results (from the past 240 hour(s))  SARS CORONAVIRUS 2 (TAT 6-24 HRS) Nasopharyngeal Nasopharyngeal Swab     Status: Abnormal   Collection Time: 02/20/20  4:48 PM   Specimen: Nasopharyngeal Swab  Result Value Ref Range Status   SARS Coronavirus 2 POSITIVE (A) NEGATIVE Final    Comment: RESULT CALLED TO, READ BACK BY AND VERIFIED WITH: 02/22/20, RN 02/21/20 BTAYLOR (NOTE) SARS-CoV-2 target nucleic acids are DETECTED.  The SARS-CoV-2 RNA is generally detectable in upper and lower respiratory specimens during the acute phase of infection. Positive results are indicative of the presence of SARS-CoV-2 RNA. Clinical correlation with patient history and other diagnostic information is  necessary to determine patient infection status. Positive results do not rule out bacterial infection or co-infection with other viruses.  The expected result is Negative.  Fact Sheet for Patients: 04/20/20  Fact Sheet for Healthcare Providers: HairSlick.no  This test is not yet approved or cleared by the [employer] FDA and  has been authorized for detection and/or diagnosis of SARS-CoV-2 by FDA under an Emergency Use Authorization (EUA). This EUA will remain  in effect (meaning this test can be used ) for the duration of the COVID-19 declaration under Section 564(b)(1) of the Act, 21 U.S.C. section  360bbb-3(b)(1), unless the authorization is terminated or revoked sooner.   Performed at [facility], [address], [place], [place] [address]      Labs: BNP (last 3 results) No results for input(s): BNP in the last 8760 hours. Basic Metabolic Panel: Recent Labs  Lab 02/20/20 1056 02/21/20 0535 02/22/20 0528  NA 140 143 141  K 4.2 3.3* 3.5  CL 104 108 108  CO2 25 25 26   GLUCOSE 128* 82 85  BUN 12 9 8   CREATININE  0.67 0.74 0.60  CALCIUM 9.7 8.7* 8.7*   Liver Function Tests: Recent Labs  Lab 02/20/20 1056  AST 22  ALT 17  ALKPHOS 48  BILITOT 0.5  PROT 7.7  ALBUMIN 4.3   Recent Labs  Lab 02/20/20 1056  LIPASE 17   No results for input(s): AMMONIA in the last 168 hours. CBC: Recent Labs  Lab 02/20/20 1900 02/20/20 2227 02/21/20 0535 02/21/20 1030 02/22/20 0528  WBC 5.0 6.2 7.0 6.0 4.3  HGB 15.3* 14.7 14.6 13.6 13.3  HCT 47.4* 45.4 46.1* 42.3 41.3  MCV 89.9 89.4 90.9 89.2 89.8  PLT 270 262 234 240 229   Cardiac Enzymes: No results for input(s): CKTOTAL, CKMB, CKMBINDEX, TROPONINI in the last 168 hours. BNP: Invalid input(s): POCBNP CBG: Recent Labs  Lab 02/21/20 0825 02/22/20 0732  GLUCAP 75 70   D-Dimer No results for input(s): DDIMER in the last 72 hours. Hgb A1c No results for input(s): HGBA1C in the last 72 hours. Lipid Profile No results for input(s): CHOL, HDL, LDLCALC, TRIG, CHOLHDL, LDLDIRECT in the last 72 hours. Thyroid function studies No results for input(s): TSH, T4TOTAL, T3FREE, THYROIDAB in the last 72 hours.  Invalid input(s): FREET3 Anemia work up No results for input(s): VITAMINB12, FOLATE, FERRITIN, TIBC, IRON, RETICCTPCT in the last 72 hours. Urinalysis    Component Value Date/Time   COLORURINE AMBER (A) 02/20/2020 2020   APPEARANCEUR HAZY (A) 02/20/2020 2020   APPEARANCEUR Cloudy 06/01/2014 1616   LABSPEC 1.032 (H) 02/20/2020 2020   LABSPEC 1.027 06/01/2014 1616   PHURINE 6.0 02/20/2020 2020   GLUCOSEU NEGATIVE 02/20/2020 2020   GLUCOSEU Negative 06/01/2014 1616   HGBUR NEGATIVE 02/20/2020 2020   BILIRUBINUR NEGATIVE 02/20/2020 2020   BILIRUBINUR Negative 06/01/2014 1616   KETONESUR 20 (A) 02/20/2020 2020   PROTEINUR >=300 (A) 02/20/2020 2020   NITRITE NEGATIVE 02/20/2020 2020   LEUKOCYTESUR NEGATIVE 02/20/2020 2020   LEUKOCYTESUR Negative 06/01/2014 1616   Sepsis Labs Invalid input(s): PROCALCITONIN,  WBC,   LACTICIDVEN Microbiology Recent Results (from the past 240 hour(s))  SARS CORONAVIRUS 2 (TAT 6-24 HRS) Nasopharyngeal Nasopharyngeal Swab     Status: Abnormal   Collection Time: 02/20/20  4:48 PM   Specimen: Nasopharyngeal Swab  Result Value Ref Range Status   SARS Coronavirus 2 POSITIVE (A) NEGATIVE Final    Comment: RESULT CALLED TO, READ BACK BY AND VERIFIED WITH: 08/01/2014, RN 02/21/20 BTAYLOR (NOTE) SARS-CoV-2 target nucleic acids are DETECTED.  The SARS-CoV-2 RNA is generally detectable in upper and lower respiratory specimens during the acute phase of infection. Positive results are indicative of the presence of SARS-CoV-2 RNA. Clinical correlation with patient history and other diagnostic information is  necessary to determine patient infection status. Positive results do not rule out bacterial infection or co-infection with other viruses.  The expected result is Negative.  Fact Sheet for Patients: Charisse March  Fact Sheet for Healthcare Providers: 04/20/20  This test is not yet approved or cleared by  the [employer] and  has been authorized for detection and/or diagnosis of SARS-CoV-2 by FDA under an Emergency Use Authorization (EUA). This EUA will remain  in effect (meaning this test can be used ) for the duration of the COVID-19 declaration under Section 564(b)(1) of the Act, 21 U.S.C. section 360bbb-3(b)(1), unless the authorization is terminated or revoked sooner.   Performed at [facility], [place] [address], [place], [place] [address]      Time coordinating discharge: Over 30 minutes  SIGNED:   Wyvonnia Dusky, MD  [facility] 02/22/2020, 11:01 AM Pager   If 7PM-7AM, please contact night-coverage

## 2020-06-08 ENCOUNTER — Encounter: Payer: Self-pay | Admitting: Obstetrics and Gynecology

## 2020-06-24 ENCOUNTER — Ambulatory Visit: Payer: Medicaid Other | Admitting: Obstetrics & Gynecology

## 2020-08-03 ENCOUNTER — Emergency Department
Admission: EM | Admit: 2020-08-03 | Discharge: 2020-08-03 | Discharge status: Against Medical Advice | Payer: Medicaid Other

## 2020-12-06 ENCOUNTER — Emergency Department
Admission: EM | Admit: 2020-12-06 | Discharge: 2020-12-06 | Disposition: A | Discharge status: Home / Self Care | Payer: Medicaid Other | Attending: Emergency Medicine | Admitting: Emergency Medicine

## 2020-12-06 ENCOUNTER — Emergency Department: Payer: Medicaid Other

## 2020-12-06 ENCOUNTER — Other Ambulatory Visit: Payer: Self-pay

## 2020-12-06 DIAGNOSIS — F17210 Nicotine dependence, cigarettes, uncomplicated: Secondary | ICD-10-CM | POA: Diagnosis not present

## 2020-12-06 DIAGNOSIS — K2900 Acute gastritis without bleeding: Secondary | ICD-10-CM | POA: Insufficient documentation

## 2020-12-06 DIAGNOSIS — R079 Chest pain, unspecified: Secondary | ICD-10-CM | POA: Diagnosis not present

## 2020-12-06 DIAGNOSIS — Z79899 Other long term (current) drug therapy: Secondary | ICD-10-CM | POA: Insufficient documentation

## 2020-12-06 DIAGNOSIS — I10 Essential (primary) hypertension: Secondary | ICD-10-CM | POA: Insufficient documentation

## 2020-12-06 DIAGNOSIS — R112 Nausea with vomiting, unspecified: Secondary | ICD-10-CM | POA: Diagnosis present

## 2020-12-06 DIAGNOSIS — R1010 Upper abdominal pain, unspecified: Secondary | ICD-10-CM

## 2020-12-06 LAB — CBC WITH DIFFERENTIAL/PLATELET
Abs Immature Granulocytes: 0.04 10*3/uL (ref 0.00–0.07)
Basophils Absolute: 0 10*3/uL (ref 0.0–0.1)
Basophils Relative: 0 %
Eosinophils Absolute: 0 10*3/uL (ref 0.0–0.5)
Eosinophils Relative: 0 %
HCT: 46.3 % — ABNORMAL HIGH (ref 36.0–46.0)
Hemoglobin: 15.6 g/dL — ABNORMAL HIGH (ref 12.0–15.0)
Immature Granulocytes: 0 %
Lymphocytes Relative: 16 %
Lymphs Abs: 1.8 10*3/uL (ref 0.7–4.0)
MCH: 29.8 pg (ref 26.0–34.0)
MCHC: 33.7 g/dL (ref 30.0–36.0)
MCV: 88.4 fL (ref 80.0–100.0)
Monocytes Absolute: 1 10*3/uL (ref 0.1–1.0)
Monocytes Relative: 9 %
Neutro Abs: 8.6 10*3/uL — ABNORMAL HIGH (ref 1.7–7.7)
Neutrophils Relative %: 75 %
Platelets: 363 10*3/uL (ref 150–400)
RBC: 5.24 MIL/uL — ABNORMAL HIGH (ref 3.87–5.11)
RDW: 11.9 % (ref 11.5–15.5)
WBC: 11.4 10*3/uL — ABNORMAL HIGH (ref 4.0–10.5)
nRBC: 0 % (ref 0.0–0.2)

## 2020-12-06 LAB — COMPREHENSIVE METABOLIC PANEL
ALT: 17 U/L (ref 0–44)
AST: 19 U/L (ref 15–41)
Albumin: 4.4 g/dL (ref 3.5–5.0)
Alkaline Phosphatase: 50 U/L (ref 38–126)
Anion gap: 12 (ref 5–15)
BUN: 13 mg/dL (ref 6–20)
CO2: 29 mmol/L (ref 22–32)
Calcium: 9.8 mg/dL (ref 8.9–10.3)
Chloride: 101 mmol/L (ref 98–111)
Creatinine, Ser: 1.05 mg/dL — ABNORMAL HIGH (ref 0.44–1.00)
GFR, Estimated: >60 mL/min (ref >60)
Glucose, Bld: 115 mg/dL — ABNORMAL HIGH (ref 70–99)
Potassium: 3.3 mmol/L — ABNORMAL LOW (ref 3.5–5.1)
Sodium: 142 mmol/L (ref 135–145)
Total Bilirubin: 1 mg/dL (ref 0.3–1.2)
Total Protein: 7.6 g/dL (ref 6.5–8.1)

## 2020-12-06 MED ORDER — MORPHINE SULFATE (PF) 4 MG/ML IV SOLN
4.0000 mg | Freq: Once | INTRAVENOUS | Status: AC
  Administered 2020-12-06: 4 mg via INTRAVENOUS
  Filled 2020-12-06: qty 1

## 2020-12-06 MED ORDER — ONDANSETRON HCL 4 MG/2ML IJ SOLN
4.0000 mg | Freq: Once | INTRAMUSCULAR | Status: AC
  Administered 2020-12-06: 4 mg via INTRAVENOUS
  Filled 2020-12-06: qty 2

## 2020-12-06 MED ORDER — LIDOCAINE VISCOUS HCL 2 % MT SOLN: 15.0000 mL | Freq: Four times a day (QID) | OROMUCOSAL | 0 refills | Status: AC | PRN

## 2020-12-06 MED ORDER — SODIUM CHLORIDE 0.9 % IV SOLN
1000.0000 mL | Freq: Once | INTRAVENOUS | Status: AC
  Administered 2020-12-06: 1000 mL via INTRAVENOUS

## 2020-12-06 MED ORDER — IOHEXOL 300 MG/ML  SOLN
100.0000 mL | Freq: Once | INTRAMUSCULAR | Status: AC | PRN
  Administered 2020-12-06: 100 mL via INTRAVENOUS
  Filled 2020-12-06: qty 100

## 2020-12-06 MED ORDER — ONDANSETRON 4 MG PO TBDP: 4.0000 mg | ORAL_TABLET | Freq: Three times a day (TID) | ORAL | 0 refills | Status: DC | PRN

## 2020-12-06 NOTE — ED Notes (Signed)
Patients friend asked patient if this is like when she had something twisted in her.  I clarified and ? Hernia--had laproscopic surgery.  Patient says it does feel like that.  She has not been to CT scan yet.

## 2020-12-06 NOTE — ED Notes (Signed)
Patient is quiet at times and and then is moaning and rolling on bed.  She still has pain and nausea, but she is not vomiting now.  Her skin is warm and dry.  She looks better than earlier.

## 2020-12-06 NOTE — ED Provider Notes (Signed)
[facility] Emergency Department Provider Note   ____________________________________________    I have reviewed the triage vital signs and the nursing notes.   HISTORY  Chief Complaint Emesis     HPI Elizabeth Gould is a 40 y.o. female who presents with complaints of nausea and vomiting.  Patient reports that she drank a lot of tequila on Saturday night, she reports she has had significant nausea since Sunday morning has vomited numerous times.  She describes burning in her chest and abdomen from vomiting.  Still feels nauseated.  Past Medical History:  Diagnosis Date   Endometriosis    Hypertension     Patient Active Problem List   Diagnosis Date Noted   GI bleed 02/21/2020   Intractable nausea and vomiting 02/20/2020   Coffee ground emesis 02/20/2020   Epigastric abdominal pain 02/20/2020   Hypertension    GERD (gastroesophageal reflux disease)    Tobacco abuse    Endometriosis 10/11/2016   Chronic pelvic pain in female 10/11/2016    Past Surgical History:  Procedure Laterality Date   CESAREAN SECTION      Prior to Admission medications   Medication Sig Start Date End Date Taking? Authorizing Provider  lidocaine (XYLOCAINE) 2 % solution Use as directed 15 mLs in the mouth or throat every 6 (six) hours as needed for mouth pain. 12/06/20  Yes Jene Every, MD  ondansetron (ZOFRAN ODT) 4 MG disintegrating tablet Take 1 tablet (4 mg total) by mouth every 8 (eight) hours as needed. 12/06/20  Yes Jene Every, MD  hydrochlorothiazide (HYDRODIURIL) 25 MG tablet Take 25 mg by mouth daily. Patient not taking: No sig reported 05/25/17   [provider]  losartan (COZAAR) 25 MG tablet Take 25 mg by mouth daily. Patient not taking: No sig reported 11/04/19   [provider]  pantoprazole (PROTONIX) 40 MG tablet Take 1 tablet (40 mg total) by mouth daily. 02/22/20 03/23/20  Charise Killian, MD     Allergies Patient has no  known allergies.  Family History  Problem Relation Age of Onset   Hypertension Mother    Diabetes Paternal Grandmother    Diabetes Paternal Grandfather     Social History Social History   Tobacco Use   Smoking status: Every Day    Packs/day: 0.50    Types: Cigarettes   Smokeless tobacco: Never  Vaping Use   Vaping Use: Never used  Substance Use Topics   Alcohol use: No   Drug use: No    Review of Systems  Constitutional: No fever/chills Eyes: No visual changes.  ENT: No sore throat. Cardiovascular: Denies chest pain. Respiratory: Denies shortness of breath. Gastrointestinal: As above Genitourinary: Negative for dysuria. Musculoskeletal: Negative for back pain. Skin: Negative for rash. Neurological: Negative for headaches or weakness   ____________________________________________   PHYSICAL EXAM:  VITAL SIGNS: ED Triage Vitals  Enc Vitals Group     BP 12/06/20 1104 (!) 165/99     Pulse Rate 12/06/20 1104 93     Resp 12/06/20 1104 20     Temp 12/06/20 1104 98.8 F (37.1 C)     Temp Source 12/06/20 1104 Oral     SpO2 12/06/20 1104 100 %     Weight 12/06/20 1104 99.8 kg (220 lb)     Height 12/06/20 1104 1.727 m (5\' 8" )     Head Circumference --      Peak Flow --      Pain Score 12/06/20 1115 9  Pain Loc --      Pain Edu? --      Excl. in GC? --     Constitutional: Alert and oriented. No acute distress.  Eyes: Conjunctivae are normal.  Head: Atraumatic. Nose: No congestion/rhinnorhea. Mouth/Throat: Mucous membranes are moist.    Cardiovascular: Normal rate, regular rhythm. s.  Good peripheral circulation. Respiratory: Normal respiratory effort.  No retractions. Gastrointestinal: Soft and nontender. No distention.  No CVA tenderness.  Reassuring exam  Musculoskeletal: No lower extremity tenderness nor edema.  Warm and well perfused Neurologic:  Normal speech and language. No gross focal neurologic deficits are appreciated.  Skin:  Skin is warm,  dry and intact. No rash noted. Psychiatric: Mood and affect are normal. Speech and behavior are normal.  ____________________________________________   LABS (all labs ordered are listed, but only abnormal results are displayed)  Labs Reviewed  COMPREHENSIVE METABOLIC PANEL - Abnormal; Notable for the following components:      Result Value   Potassium 3.3 (*)    Glucose, Bld 115 (*)    Creatinine, Ser 1.05 (*)    All other components within normal limits  CBC WITH DIFFERENTIAL/PLATELET - Abnormal; Notable for the following components:   WBC 11.4 (*)    RBC 5.24 (*)    Hemoglobin 15.6 (*)    HCT 46.3 (*)    Neutro Abs 8.6 (*)    All other components within normal limits  URINALYSIS, COMPLETE (UACMP) WITH MICROSCOPIC  PREGNANCY, URINE   ____________________________________________  EKG  __________________________________________  RADIOLOGY  None ____________________________________________   PROCEDURES  Procedure(s) performed: No  Procedures   Critical Care performed: No ____________________________________________   INITIAL IMPRESSION / ASSESSMENT AND PLAN / ED COURSE  Pertinent labs & imaging results that were available during my care of the patient were reviewed by me and considered in my medical decision making (see chart for details).   Patient presents with primary complaint of nausea and vomiting likely related to alcohol ingestion.  Overall she is well-appearing and nontoxic in appearance.  Denies any hematemesis, not consistent with Boerhaave's, reassuring abdominal exam.,  No crepitus. Lab work demonstrates very mild elevation of white blood cell count.  We will treat with IV fluids, IV Zofran and reevaluate.   Insert time stamp, after fluids and IV Zofran patient feeling much better, appropriate for discharge at this time    ____________________________________________   FINAL CLINICAL IMPRESSION(S) / ED DIAGNOSES  Final diagnoses:  Acute  gastritis without hemorrhage, unspecified gastritis type        Note:  This document was prepared using Dragon voice recognition software and may include unintentional dictation errors.    Jene Every, MD 12/06/20 1606

## 2020-12-06 NOTE — ED Notes (Signed)
Drowsy from morphine now. She did not tolerate the ct well.  Was vomiting again.  She is now on her side,  arouses easily to verbal.  When asked if she is feeling better than before she nods yes

## 2020-12-06 NOTE — ED Provider Notes (Signed)
Emergency Medicine Provider Triage Evaluation Note  Elizabeth Gould , a 40 y.o. female  was evaluated in triage.  Pt complains of chest and throat burning since Sunday morning after drinking a lot of tequilla Saturday night. No abdominal pain or fever. No cough or other viral symptoms.  Review of Systems  Positive: Chest and throat burning. Negative: Fever, abdominal pain, cough  Physical Exam  BP (!) 165/99 (BP Location: Right Arm)   Pulse 93   Temp 98.8 F (37.1 C) (Oral)   Resp 20   Ht 5\' 8"  (1.727 m)   Wt 99.8 kg   SpO2 100%   BMI 33.45 kg/m  Gen:   Awake, no distress   Resp:  Normal effort  MSK:   Moves extremities without difficulty  Other:    Medical Decision Making  Medically screening exam initiated at 11:17 AM.  Appropriate orders placed.  Elizabeth Gould was informed that the remainder of the evaluation will be completed by another provider, this initial triage assessment does not replace that evaluation, and the importance of remaining in the ED until their evaluation is complete.    Elizabeth Link, FNP 12/06/20 1119    12/08/20, MD 12/06/20 [phone]

## 2020-12-06 NOTE — ED Triage Notes (Signed)
Pt states that she was drinking Saturday, tequila, states that she has ben vomiting since Sunday am. Pt is c/o sore throat from vomiting and points across to the top right of her chest and reports she thinks she has alcohol poisoning

## 2021-10-28 ENCOUNTER — Other Ambulatory Visit: Payer: Self-pay

## 2021-10-28 ENCOUNTER — Emergency Department
Admission: EM | Admit: 2021-10-28 | Discharge: 2021-10-28 | Disposition: A | Discharge status: Home / Self Care | Payer: Medicaid Other | Attending: Emergency Medicine | Admitting: Emergency Medicine

## 2021-10-28 ENCOUNTER — Emergency Department: Payer: Medicaid Other

## 2021-10-28 DIAGNOSIS — N12 Tubulo-interstitial nephritis, not specified as acute or chronic: Secondary | ICD-10-CM | POA: Insufficient documentation

## 2021-10-28 DIAGNOSIS — R109 Unspecified abdominal pain: Secondary | ICD-10-CM | POA: Diagnosis present

## 2021-10-28 LAB — URINALYSIS, ROUTINE W REFLEX MICROSCOPIC
Bilirubin Urine: NEGATIVE
Glucose, UA: NEGATIVE mg/dL
Ketones, ur: NEGATIVE mg/dL
Nitrite: POSITIVE — AB
Protein, ur: >=300 mg/dL — AB
Specific Gravity, Urine: 1.017 (ref 1.005–1.030)
WBC, UA: >50 WBC/hpf — ABNORMAL HIGH (ref 0–5)
pH: 5 (ref 5.0–8.0)

## 2021-10-28 LAB — COMPREHENSIVE METABOLIC PANEL
ALT: 46 U/L — ABNORMAL HIGH (ref 0–44)
AST: 33 U/L (ref 15–41)
Albumin: 3.8 g/dL (ref 3.5–5.0)
Alkaline Phosphatase: 61 U/L (ref 38–126)
Anion gap: 9 (ref 5–15)
BUN: 7 mg/dL (ref 6–20)
CO2: 27 mmol/L (ref 22–32)
Calcium: 9.4 mg/dL (ref 8.9–10.3)
Chloride: 105 mmol/L (ref 98–111)
Creatinine, Ser: 0.67 mg/dL (ref 0.44–1.00)
GFR, Estimated: >60 mL/min (ref >60)
Glucose, Bld: 108 mg/dL — ABNORMAL HIGH (ref 70–99)
Potassium: 3.7 mmol/L (ref 3.5–5.1)
Sodium: 141 mmol/L (ref 135–145)
Total Bilirubin: 0.3 mg/dL (ref 0.3–1.2)
Total Protein: 7.8 g/dL (ref 6.5–8.1)

## 2021-10-28 LAB — CBC
HCT: 47.4 % — ABNORMAL HIGH (ref 36.0–46.0)
Hemoglobin: 15.3 g/dL — ABNORMAL HIGH (ref 12.0–15.0)
MCH: 28.9 pg (ref 26.0–34.0)
MCHC: 32.3 g/dL (ref 30.0–36.0)
MCV: 89.4 fL (ref 80.0–100.0)
Platelets: 294 10*3/uL (ref 150–400)
RBC: 5.3 MIL/uL — ABNORMAL HIGH (ref 3.87–5.11)
RDW: 11.6 % (ref 11.5–15.5)
WBC: 10.7 10*3/uL — ABNORMAL HIGH (ref 4.0–10.5)
nRBC: 0 % (ref 0.0–0.2)

## 2021-10-28 LAB — POC URINE PREG, ED: Preg Test, Ur: NEGATIVE

## 2021-10-28 LAB — LIPASE, BLOOD: Lipase: 22 U/L (ref 11–51)

## 2021-10-28 MED ORDER — IOHEXOL 300 MG/ML  SOLN
100.0000 mL | Freq: Once | INTRAMUSCULAR | Status: AC | PRN
  Administered 2021-10-28: 100 mL via INTRAVENOUS

## 2021-10-28 MED ORDER — ONDANSETRON HCL 4 MG PO TABS: 4.0000 mg | ORAL_TABLET | Freq: Every day | ORAL | 1 refills | Status: AC | PRN

## 2021-10-28 MED ORDER — SODIUM CHLORIDE 0.9 % IV BOLUS
1000.0000 mL | Freq: Once | INTRAVENOUS | Status: AC
  Administered 2021-10-28: 1000 mL via INTRAVENOUS

## 2021-10-28 MED ORDER — MORPHINE SULFATE (PF) 4 MG/ML IV SOLN
4.0000 mg | Freq: Once | INTRAVENOUS | Status: AC
  Administered 2021-10-28: 4 mg via INTRAVENOUS
  Filled 2021-10-28: qty 1

## 2021-10-28 MED ORDER — ONDANSETRON HCL 4 MG/2ML IJ SOLN
4.0000 mg | Freq: Once | INTRAMUSCULAR | Status: AC
  Administered 2021-10-28: 4 mg via INTRAVENOUS
  Filled 2021-10-28: qty 2

## 2021-10-28 MED ORDER — SULFAMETHOXAZOLE-TRIMETHOPRIM 800-160 MG PO TABS: 1.0000 | ORAL_TABLET | Freq: Two times a day (BID) | ORAL | 0 refills | Status: AC

## 2021-10-28 MED ORDER — DROPERIDOL 2.5 MG/ML IJ SOLN
1.2500 mg | Freq: Once | INTRAMUSCULAR | Status: AC
  Administered 2021-10-28: 1.25 mg via INTRAVENOUS
  Filled 2021-10-28: qty 2

## 2021-10-28 MED ORDER — SODIUM CHLORIDE 0.9 % IV SOLN
1.0000 g | Freq: Once | INTRAVENOUS | Status: AC
  Administered 2021-10-28: 1 g via INTRAVENOUS
  Filled 2021-10-28: qty 10

## 2021-10-28 NOTE — Discharge Instructions (Signed)
Take antibiotics for the full course as prescribed.   Thank you for choosing us for your health care today!  Please see your primary doctor this week for a follow up appointment.   If you do not have a primary doctor call the following clinics to establish care:  If you have insurance:  [facility] [phone] [address] [place] [address]   [facility]  [phone] [address] [place] [address]   If you do not have insurance:  [facility]  [phone] [address] [place] [address]  Sometimes, in the early stages of certain disease courses it is difficult to detect in the emergency department evaluation -- so, it is important that you continue to monitor your symptoms and call your doctor right away or return to the emergency department if you develop any new or worsening symptoms.  It was my pleasure to care for you today.   Sharyl Panchal S. Desani Sprung, MD   

## 2021-10-28 NOTE — ED Notes (Signed)
Pt endorsing nausea and vomiting at this time.

## 2021-10-28 NOTE — ED Triage Notes (Signed)
Pt arrives with c/o ABD pain that started about 4 days ago. Pt endorses nausea. Pt denies vomiting, diarrhea, or fevers.

## 2021-10-28 NOTE — ED Provider Notes (Signed)
[facility] Provider Note    Event Date/Time   First MD Initiated Contact with Patient 10/28/21 1050     (approximate)   History   Abdominal Pain   HPI  Elizabeth Gould is a 41 y.o. female   Past medical history of endometriosis presents to the emergency department with abdominal pain that started about 4 days ago that has been restively worsening.  Some nausea, no vomiting or diarrhea or fevers.  No urinary symptoms.  No vaginal discharge.  On exam, she looks uncomfortable and has diffuse tenderness to palpation in all quadrants without rigidity or guarding.  No fever.  Hypertensive 150/80.  History was obtained via patient and her partner at bedside      Physical Exam   Triage Vital Signs: ED Triage Vitals  Enc Vitals Group     BP 10/28/21 1003 (!) 164/97     Pulse Rate 10/28/21 1003 (!) 101     Resp 10/28/21 1003 16     Temp 10/28/21 1003 98.2 F (36.8 C)     Temp src --      SpO2 10/28/21 1003 100 %     Weight 10/28/21 1003 235 lb (106.6 kg)     Height --      Head Circumference --      Peak Flow --      Pain Score 10/28/21 1002 10     Pain Loc --      Pain Edu? --      Excl. in GC? --     Most recent vital signs: Vitals:   10/28/21 1307 10/28/21 1420  BP: (!) 164/91 (!) 160/99  Pulse: 90 97  Resp: 18 16  Temp:    SpO2: 93% 97%    General: Awake, no distress.  CV:  Good peripheral perfusion.  Resp:  Normal effort.  Abd:  No distention.  Mild tenderness to palpation all quadrants.  No rigidity or guarding.   ED Results / Procedures / Treatments   Labs (all labs ordered are listed, but only abnormal results are displayed) Labs Reviewed  COMPREHENSIVE METABOLIC PANEL - Abnormal; Notable for the following components:      Result Value   Glucose, Bld 108 (*)    ALT 46 (*)    All other components within normal limits  CBC - Abnormal; Notable for the following components:   WBC 10.7 (*)    RBC 5.30 (*)    Hemoglobin  15.3 (*)    HCT 47.4 (*)    All other components within normal limits  URINALYSIS, ROUTINE W REFLEX MICROSCOPIC - Abnormal; Notable for the following components:   Color, Urine AMBER (*)    APPearance CLOUDY (*)    Hgb urine dipstick MODERATE (*)    Protein, ur >=300 (*)    Nitrite POSITIVE (*)    Leukocytes,Ua MODERATE (*)    WBC, UA >50 (*)    Bacteria, UA MANY (*)    All other components within normal limits  URINE CULTURE  LIPASE, BLOOD  POC URINE PREG, ED  I-STAT BETA HCG BLOOD, ED ([facility], [facility], [facility] ONLY)     I reviewed labs and they are notable for blood cell count 10.7, urinalysis with positive nitrates and leukocytes RADIOLOGY I independently reviewed and interpreted ET scan of the abdomen pelvis and see no obvious infectious processes or obstructive pattern   PROCEDURES:  Critical Care performed: No  Procedures   MEDICATIONS ORDERED IN ED: Medications  ondansetron (  ZOFRAN) injection 4 mg (4 mg Intravenous Given 10/28/21 1114)  morphine (PF) 4 MG/ML injection 4 mg (4 mg Intravenous Given 10/28/21 1114)  sodium chloride 0.9 % bolus 1,000 mL (0 mLs Intravenous Stopped 10/28/21 1309)  iohexol (OMNIPAQUE) 300 MG/ML solution 100 mL (100 mLs Intravenous Contrast Given 10/28/21 1406)  droperidol (INAPSINE) 2.5 MG/ML injection 1.25 mg (1.25 mg Intravenous Given 10/28/21 1427)  cefTRIAXone (ROCEPHIN) 1 g in sodium chloride 0.9 % 100 mL IVPB (1 g Intravenous New Bag/Given 10/28/21 1427)     IMPRESSION / MDM / ASSESSMENT AND PLAN / ED COURSE  I reviewed the triage vital signs and the nursing notes.                              Differential diagnosis includes, but is not limited to, intra-abdominal infection or obstruction, urinary tract infection pyelonephritis, pregnancy related so check pregnancy test.  Testing including basic blood work, LFTs and lipase, urinalysis, urine pregnancy test and a CT scan of the abdomen pelvis with IV contrast.  Pain control and antiemetics via IV, Zofran  and morphine.    MDM: This patient had cT scan showed signs of fat stranding in the right kidney consistent with pyelonephritis and a urinalysis consistent with the same.  Started on ceftriaxone IV in the emergency department and given antiemetics.  She remained stable, nontoxic, well-appearing.  Plan for outpatient treatment for pyelonephritis.  Dispo: After careful consideration of this patient's presentation, medical and social risk factors, and evaluation in the emergency department I engaged in shared decision making with the patient and/or their representative to consider admission or observation and this patient was ultimately discharged because safe home environment w caretaker, stable condition, plan for outpt abx and f/u. Marland Kitchen   Patient's presentation is most consistent with acute presentation with potential threat to life or bodily function.       FINAL CLINICAL IMPRESSION(S) / ED DIAGNOSES   Final diagnoses:  Pyelonephritis     Rx / DC Orders   ED Discharge Orders          Ordered    sulfamethoxazole-trimethoprim (BACTRIM DS) 800-160 MG tablet  2 times daily        10/28/21 1512    ondansetron (ZOFRAN) 4 MG tablet  Daily PRN        10/28/21 1519             Note:  This document was prepared using Dragon voice recognition software and may include unintentional dictation errors.    Pilar Jarvis, MD 10/28/21 [phone]

## 2021-10-30 LAB — URINE CULTURE: Culture: >=100000 — AB
